# Patient Record
Sex: Male | Born: 1945 | Race: White | Hispanic: No | Marital: Married | Smoking: Never smoker
Health system: Southern US, Community
[De-identification: ages and names within clinical notes are randomized; demographics above are authoritative.]

## PROBLEM LIST (undated history)

## (undated) ENCOUNTER — Ambulatory Visit (HOSPITAL_COMMUNITY): Discharge status: Absent without leave | Payer: Commercial Managed Care - HMO

## (undated) DIAGNOSIS — E039 Hypothyroidism, unspecified: Principal | ICD-10-CM

## (undated) HISTORY — PX: CATARACT EXTRACTION: SUR2

## (undated) HISTORY — PX: APPENDECTOMY: SHX54

## (undated) HISTORY — PX: HERNIA REPAIR: SHX51

## (undated) HISTORY — DX: Hypothyroidism, unspecified: E03.9

## (undated) HISTORY — PX: BACK SURGERY: SHX140

## (undated) HISTORY — PX: VASECTOMY: SHX75

---

## 2007-05-21 ENCOUNTER — Encounter: Admission: RE | Admit: 2007-05-21 | Discharge: 2007-05-21 | Payer: Self-pay | Admitting: Family Medicine

## 2012-09-21 ENCOUNTER — Encounter: Payer: Self-pay | Admitting: Family Medicine

## 2012-09-21 ENCOUNTER — Ambulatory Visit (INDEPENDENT_AMBULATORY_CARE_PROVIDER_SITE_OTHER): Payer: Medicare Other | Admitting: Family Medicine

## 2012-09-21 VITALS — BP 117/71 | HR 85 | Ht 72.0 in | Wt 198.0 lb

## 2012-09-21 DIAGNOSIS — Z131 Encounter for screening for diabetes mellitus: Secondary | ICD-10-CM

## 2012-09-21 DIAGNOSIS — H9190 Unspecified hearing loss, unspecified ear: Secondary | ICD-10-CM

## 2012-09-21 DIAGNOSIS — Z13220 Encounter for screening for lipoid disorders: Secondary | ICD-10-CM

## 2012-09-21 DIAGNOSIS — E039 Hypothyroidism, unspecified: Secondary | ICD-10-CM | POA: Insufficient documentation

## 2012-09-21 HISTORY — DX: Hypothyroidism, unspecified: E03.9

## 2012-09-21 NOTE — Progress Notes (Signed)
CC: Joel Hopkins is a 66 y.o. male is here for Establish Care   Subjective: HPI:  Patient presents to establish care with no acute complaints. Has not seen a physician in over 4 years other than getting DOT physicals.  Carries a diagnosis of hypothyroidism, has been taking nature thyroid once a day. Denies missed doses recently. Denies rapid heartbeat, sluggishness, tremor, anxiety, fatigue, constipation, diarrhea, unintentional weight loss nor gain, skin changes nor hair changes.  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 Seems to have difficulty with high frequencies more than low. Seems to be worse in situations with background noise. Has no trouble understanding voices if he is able to read lips. He does not in the way of his quality of life. He has a mild ringing in both ears without a sense of fullness, ear pain, nor roaring in the ears.  He states he would not like to wear hearing even if indicated.  He believes it's been well over 4 years since he was screen for type 2 diabetes or lipid abnormalities.   Review of Systems - General ROS: negative for - chills, fever, night sweats, weight gain or weight loss Ophthalmic ROS: negative for - decreased vision Psychological ROS: negative for - anxiety or depression ENT ROS: negative for - hearing change, nasal congestion, tinnitus or allergies Hematological and Lymphatic ROS: negative for - bleeding problems, bruising or swollen lymph nodes Breast ROS: negative Respiratory ROS: no cough, shortness of breath, or wheezing Cardiovascular ROS: no chest pain or dyspnea on exertion Gastrointestinal ROS: no abdominal pain, change in bowel habits, or black or bloody stools Genito-Urinary ROS: negative for - genital discharge, genital ulcers, incontinence or abnormal bleeding from genitals Musculoskeletal ROS: negative for - joint pain or muscle pain Neurological ROS:  negative for - headaches or memory loss Dermatological ROS: negative for lumps, mole changes, rash and skin lesion changes  Past Medical History  Diagnosis Date  . Hypothyroid 09/21/2012     Family History  Problem Relation Age of Onset  . Colon cancer Sister   . Diabetes Brother      History  Substance Use Topics  . Smoking status: Never Smoker   . Smokeless tobacco: Not on file  . Alcohol Use: Not on file     Objective: Filed Vitals:   09/21/12 1629  BP: 117/71  Pulse: 85    General: Alert and Oriented, No Acute Distress HEENT: Pupils equal, round, reactive to light. Conjunctivae clear.  External ears unremarkable, canals clear with intact TMs with appropriate landmarks.  Middle ear appears open without effusion. Pink inferior turbinates.  Moist mucous membranes, pharynx without inflammation nor lesions.  Neck supple without palpable lymphadenopathy nor abnormal masses. Lungs: Clear to auscultation bilaterally, no wheezing/ronchi/rales.  Comfortable work of breathing. Good air movement. Cardiac: Regular rate and rhythm. Normal S1/S2.  No murmurs, rubs, nor gallops.   Abdomen: Soft nontender to palpation Extremities: No peripheral edema.  Strong peripheral pulses.  Mental Status: No depression, anxiety, nor agitation. Well-dressed, normal thought process, animated and quite talkative Skin: Warm and dry.  Assessment & Plan: Joel Hopkins was seen today for establish care.  Diagnoses and associated orders for this visit:  Hypothyroid - TSH  Hearing loss  Screening, lipid - Lipid panel  Screening for diabetes mellitus - BASIC METABOLIC PANEL WITH GFR  Other Orders - thyroid (ARMOUR) 32.5 MG tablet; Take 32.5 mg by mouth  daily.    Hypothyroidism: Clinically appears stable however will check TSH to determine need for adjustment of Armour Thyroid were switched to levothyroxine . Hearing loss: Stable, patient declines desire for further workup.  Screen for lipid  abnormalities, screen for type 2 diabetes and renal insufficiency with metabolic panel. I've asked him to return in 4 weeks for CPE    Return in about 4 weeks (around 10/19/2012).

## 2012-10-11 LAB — LIPID PANEL
Cholesterol: 190 mg/dL (ref 0–200)
LDL Cholesterol: 128 mg/dL — ABNORMAL HIGH (ref 0–99)
Triglycerides: 87 mg/dL (ref <150)

## 2012-10-11 LAB — TSH: TSH: 5.633 u[IU]/mL — ABNORMAL HIGH (ref 0.350–4.500)

## 2012-10-11 LAB — BASIC METABOLIC PANEL WITH GFR
CO2: 28 mEq/L (ref 19–32)
Chloride: 104 mEq/L (ref 96–112)
Creat: 1.33 mg/dL (ref 0.50–1.35)
Potassium: 4.1 mEq/L (ref 3.5–5.3)

## 2012-10-12 ENCOUNTER — Telehealth: Payer: Self-pay | Admitting: Family Medicine

## 2012-10-12 DIAGNOSIS — E039 Hypothyroidism, unspecified: Secondary | ICD-10-CM

## 2012-10-12 MED ORDER — THYROID 48.75 MG PO TABS: 48.7500 mg | ORAL_TABLET | Freq: Every day | ORAL | Status: DC

## 2012-10-12 NOTE — Telephone Encounter (Signed)
Joel Hopkins, Will you please let Joel Hopkins know that his thyroid function appears to be slightly underactive.  I've sent in a slightly higher dose of his thyroid medication to [facility] in [place].  New dose is 48.75mg  daily.  We'll need to recheck his thyroid function in three months so I'd like him to have a follow up visit then.  His cholesterol panel looks great, he's at goal so there's no need to make any other medication reccomendations. Thank you

## 2012-10-12 NOTE — Telephone Encounter (Signed)
Pt.notified

## 2012-12-09 ENCOUNTER — Encounter: Payer: Self-pay | Admitting: Family Medicine

## 2012-12-09 ENCOUNTER — Ambulatory Visit (INDEPENDENT_AMBULATORY_CARE_PROVIDER_SITE_OTHER): Payer: Medicare Other | Admitting: Family Medicine

## 2012-12-09 VITALS — BP 121/74 | HR 81 | Temp 97.9°F | Wt 201.0 lb

## 2012-12-09 DIAGNOSIS — H6990 Unspecified Eustachian tube disorder, unspecified ear: Secondary | ICD-10-CM

## 2012-12-09 DIAGNOSIS — H6981 Other specified disorders of Eustachian tube, right ear: Secondary | ICD-10-CM

## 2012-12-09 MED ORDER — MOMETASONE FUROATE 50 MCG/ACT NA SUSP: NASAL | Status: DC

## 2012-12-09 NOTE — Patient Instructions (Addendum)
One aleve twice a day for one week.  Use nasal spray daily for two weeks.

## 2012-12-09 NOTE — Progress Notes (Signed)
CC: Joel Hopkins is a 67 y.o. male is here for right ear pain and Sinusitis   Subjective: HPI:  Patient presents of right ear pain that has been present for 3 weeks. Since Tuesday of this week it has gone from a mild to moderate severity. His behavior has always been spasm-like. He feels as if he has a tightening within his ear without predictability that lasts less than a second. It can occur multiple times in our or no times on an hour. It was interfering with sleep last night, however he's noticed ibuprofen helps prevent the pain. He's noticed that if he is a hot shower with water running over his right head and ear pain does not occur. Nothing else makes better or worse. She's never had this before. He can be present any time of the day. No interventions other than above. He denies fevers, chills, hearing loss, ringing in ears, ear discharge, nasal congestion, facial pain, cough, shortness of breath  Review Of Systems Outlined In HPI  Past Medical History  Diagnosis Date  . Hypothyroid 09/21/2012     Family History  Problem Relation Age of Onset  . Colon cancer Sister   . Diabetes Brother      History  Substance Use Topics  . Smoking status: Never Smoker   . Smokeless tobacco: Not on file  . Alcohol Use: Not on file     Objective: Filed Vitals:   12/09/12 0904  BP: 121/74  Pulse: 81  Temp: 97.9 F (36.6 C)    General: Alert and Oriented, No Acute Distress HEENT: Pupils equal, round, reactive to light. Conjunctivae clear.  External ears unremarkable, canals clear with intact TMs with appropriate landmarks.  Right middle ear has a clear effusion without bulging of the TM. Left middle ear is open without abnormalities.Pink inferior turbinates.  Moist mucous membranes, pharynx without inflammation nor lesions.  Neck supple without palpable lymphadenopathy nor abnormal masses. Lungs: Clear to auscultation bilaterally, no wheezing/ronchi/rales.  Comfortable work of breathing.  Good air movement. Cardiac: Regular rate and rhythm. Normal S1/S2.  No murmurs, rubs, nor gallops.  No carotid bruits Neuro: Cranial nerves II through XII grossly intact Mental Status: No depression, anxiety, nor agitation. Skin: Warm and dry.  Assessment & Plan: Joel Hopkins was seen today for right ear pain and sinusitis.  Diagnoses and associated orders for this visit:  Eustachian tube dysfunction, right - mometasone (NASONEX) 50 MCG/ACT nasal spray; Two sprays each nostril daily for two weeks.     discussed with patient that exam would suggest eustachian tube dysfunction on the right and pain is likely do to stapes muscle spasm. Treat with Nasonex daily for 2 weeks and Aleve twice a day as needed to help prevent the pain for the next week.  Return if symptoms worsen or fail to improve.

## 2013-03-18 ENCOUNTER — Other Ambulatory Visit: Payer: Self-pay | Admitting: Family Medicine

## 2013-04-11 ENCOUNTER — Other Ambulatory Visit: Payer: Self-pay | Admitting: Family Medicine

## 2013-05-27 ENCOUNTER — Encounter: Payer: Self-pay | Admitting: Family Medicine

## 2013-05-27 ENCOUNTER — Ambulatory Visit (INDEPENDENT_AMBULATORY_CARE_PROVIDER_SITE_OTHER): Payer: Self-pay | Admitting: Family Medicine

## 2013-05-27 VITALS — BP 118/76 | HR 95 | Temp 97.9°F | Wt 198.0 lb

## 2013-05-27 DIAGNOSIS — B9689 Other specified bacterial agents as the cause of diseases classified elsewhere: Secondary | ICD-10-CM

## 2013-05-27 DIAGNOSIS — J329 Chronic sinusitis, unspecified: Secondary | ICD-10-CM

## 2013-05-27 DIAGNOSIS — A499 Bacterial infection, unspecified: Secondary | ICD-10-CM

## 2013-05-27 MED ORDER — AMOXICILLIN-POT CLAVULANATE 500-125 MG PO TABS: ORAL_TABLET | ORAL | Status: AC

## 2013-05-27 NOTE — Progress Notes (Signed)
CC: Joel Hopkins is a 67 y.o. male is here for Nasal Congestion   Subjective: HPI:  Complains of nasal congestion right ear stuffiness of moderate severity that is worsening on a daily basis for the last 2-3 weeks. Seems to be present all hours of the day nothing seems to make better or worse. He is not taking anything to address this yet. No interventions as of yet. Slight hearing loss in right ear since this started denies headaches motor or sensory disturbances. Complains of hoarseness and also subjective fevers for the last 2 days. Denies productive cough, chest pain, shortness of breath, wheezing, nor rashes.   Review Of Systems Outlined In HPI  Past Medical History  Diagnosis Date  . Hypothyroid 09/21/2012     Family History  Problem Relation Age of Onset  . Colon cancer Sister   . Diabetes Brother      History  Substance Use Topics  . Smoking status: Never Smoker   . Smokeless tobacco: Not on file  . Alcohol Use: Not on file     Objective: Filed Vitals:   05/27/13 1032  BP: 118/76  Pulse: 95  Temp: 97.9 F (36.6 C)    General: Alert and Oriented, No Acute Distress HEENT: Pupils equal, round, reactive to light. Conjunctivae clear.  External ears unremarkable, canals clear with intact TMs with appropriate landmarks.  Middle ear appears open without effusion. Boggy and erythematous inferior turbinates with mild mucoid discharge.  Moist mucous membranes, pharynx without inflammation nor lesions.  Neck supple without palpable lymphadenopathy nor abnormal masses. Lungs: Clear to auscultation bilaterally, no wheezing/ronchi/rales.  Comfortable work of breathing. Good air movement. Cardiac: Regular rate and rhythm. Normal S1/S2.  No murmurs, rubs, nor gallops.   Extremities: No peripheral edema.  Strong peripheral pulses.  Mental Status: No depression, anxiety, nor agitation. Skin: Warm and dry.  Assessment & Plan: Teller was seen today for nasal  congestion.  Diagnoses and associated orders for this visit:  Bacterial sinusitis - amoxicillin-clavulanate (AUGMENTIN) 500-125 MG per tablet; Take one by mouth every 8 hours for ten total days.    Bacterial sinusitis: Start over-the-counter Alka-Seltzer cold and Sinus as well as Augmentin, cough no improvement by next week  Return if symptoms worsen or fail to improve.

## 2013-06-09 ENCOUNTER — Encounter: Payer: Self-pay | Admitting: Family Medicine

## 2013-06-13 ENCOUNTER — Encounter: Payer: Self-pay | Admitting: Family Medicine

## 2013-06-13 ENCOUNTER — Ambulatory Visit (INDEPENDENT_AMBULATORY_CARE_PROVIDER_SITE_OTHER): Payer: Medicare Other | Admitting: Family Medicine

## 2013-06-13 VITALS — BP 115/77 | HR 67 | Wt 198.0 lb

## 2013-06-13 DIAGNOSIS — S060X0A Concussion without loss of consciousness, initial encounter: Secondary | ICD-10-CM

## 2013-06-13 DIAGNOSIS — M47812 Spondylosis without myelopathy or radiculopathy, cervical region: Secondary | ICD-10-CM | POA: Insufficient documentation
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
## 2013-06-20 ENCOUNTER — Encounter: Payer: Self-pay | Admitting: Family Medicine

## 2013-06-20 ENCOUNTER — Ambulatory Visit (INDEPENDENT_AMBULATORY_CARE_PROVIDER_SITE_OTHER): Payer: Medicare Other | Admitting: Family Medicine

## 2013-06-20 VITALS — BP 122/70 | HR 70 | Wt 200.0 lb

## 2013-06-20 DIAGNOSIS — S060X0D Concussion without loss of consciousness, subsequent encounter: Secondary | ICD-10-CM

## 2013-06-20 DIAGNOSIS — H9311 Tinnitus, right ear: Secondary | ICD-10-CM

## 2013-06-20 DIAGNOSIS — Z5189 Encounter for other specified aftercare: Secondary | ICD-10-CM

## 2013-06-20 DIAGNOSIS — H9319 Tinnitus, unspecified ear: Secondary | ICD-10-CM

## 2013-06-20 DIAGNOSIS — H9193 Unspecified hearing loss, bilateral: Secondary | ICD-10-CM

## 2013-06-20 DIAGNOSIS — H9190 Unspecified hearing loss, unspecified ear: Secondary | ICD-10-CM
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
## 2013-06-24 ENCOUNTER — Telehealth: Payer: Self-pay | Admitting: Family Medicine

## 2013-06-24 ENCOUNTER — Encounter: Payer: Self-pay | Admitting: Family Medicine

## 2013-06-24 ENCOUNTER — Ambulatory Visit (INDEPENDENT_AMBULATORY_CARE_PROVIDER_SITE_OTHER): Payer: Medicare Other | Admitting: Family Medicine

## 2013-06-24 VITALS — BP 112/73 | HR 73 | Temp 97.6°F | Wt 197.0 lb

## 2013-06-24 DIAGNOSIS — R197 Diarrhea, unspecified: Secondary | ICD-10-CM

## 2013-06-24 DIAGNOSIS — R1032 Left lower quadrant pain: Secondary | ICD-10-CM

## 2013-06-24 DIAGNOSIS — E039 Hypothyroidism, unspecified: Secondary | ICD-10-CM

## 2013-06-24 LAB — TSH: TSH: 6.431 u[IU]/mL — ABNORMAL HIGH (ref 0.350–4.500)

## 2013-06-24 LAB — COMPLETE METABOLIC PANEL WITH GFR
ALT: 26 U/L (ref 0–53)
AST: 22 U/L (ref 0–37)
Albumin: 4.1 g/dL (ref 3.5–5.2)
Alkaline Phosphatase: 81 U/L (ref 39–117)
Calcium: 9.3 mg/dL (ref 8.4–10.5)
Chloride: 105 mEq/L (ref 96–112)
Potassium: 4.5 mEq/L (ref 3.5–5.3)
Sodium: 140 mEq/L (ref 135–145)

## 2013-06-24 LAB — CBC WITH DIFFERENTIAL/PLATELET
Hemoglobin: 14.7 g/dL (ref 13.0–17.0)
Lymphocytes Relative: 22 % (ref 12–46)
Lymphs Abs: 1.6 10*3/uL (ref 0.7–4.0)
MCH: 30.5 pg (ref 26.0–34.0)
Monocytes Relative: 8 % (ref 3–12)
Neutro Abs: 4.6 10*3/uL (ref 1.7–7.7)
Neutrophils Relative %: 64 % (ref 43–77)
Platelets: 78 10*3/uL — ABNORMAL LOW (ref 150–400)
RBC: 4.82 MIL/uL (ref 4.22–5.81)
WBC: 7.2 10*3/uL (ref 4.0–10.5)

## 2013-06-24 LAB — SEDIMENTATION RATE: Sed Rate: 1 mm/hr (ref 0–16)

## 2013-06-24 LAB — C-REACTIVE PROTEIN: CRP: 1 mg/dL — ABNORMAL HIGH (ref <0.60)

## 2013-06-24 MED ORDER — DIPHENOXYLATE-ATROPINE 2.5-0.025 MG PO TABS: 1.0000 | ORAL_TABLET | Freq: Four times a day (QID) | ORAL | Status: DC | PRN

## 2013-06-24 NOTE — Telephone Encounter (Signed)
Sue Lush, Will you please let Mr. Hreha know that his labs today do not suggest his diarrhea is from an infection which is good news.  I'd encourage him to start a generic form of Lomotil (in your inbox) over the weekend.  I'm going to place a GI referral in case this does not improve over the weekend.
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
## 2013-06-24 NOTE — Telephone Encounter (Signed)
I've never heard of such a situation and think that that would be pretty odd, however, since I don't have a good explanation I've placed a GI referral for further evaluation since it's been going on for so long.
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
## 2013-06-27 ENCOUNTER — Telehealth: Payer: Self-pay | Admitting: Family Medicine

## 2013-06-27 DIAGNOSIS — E039 Hypothyroidism, unspecified: Secondary | ICD-10-CM

## 2013-06-27 NOTE — Telephone Encounter (Signed)
Pt notified this am.

## 2013-06-27 NOTE — Telephone Encounter (Signed)
Sue Lush, Will you please let [name] (or Claris Gower his wife) know that his thyroid test from last Friday shows that his thyroid supplement is at too low of a dose.  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

## 2013-06-27 NOTE — Telephone Encounter (Signed)
Left message to call back  

## 2013-06-29 ENCOUNTER — Encounter: Payer: Self-pay | Admitting: Family Medicine

## 2013-06-29 DIAGNOSIS — R197 Diarrhea, unspecified: Secondary | ICD-10-CM | POA: Insufficient documentation

## 2013-06-29 NOTE — Telephone Encounter (Signed)
Left messge on vm yesterday with results and to call back to confirm that he has been taking the med daily

## 2013-06-30 ENCOUNTER — Telehealth: Payer: Self-pay | Admitting: Family Medicine

## 2013-06-30 DIAGNOSIS — S060X0S Concussion without loss of consciousness, sequela: Secondary | ICD-10-CM

## 2013-06-30 NOTE — Telephone Encounter (Signed)
Patient still having issues and is requesting you refer him to a neurologist

## 2013-07-01 NOTE — Telephone Encounter (Signed)
Sue Lush, Will you please let Mr. Haws know this has been placed.

## 2013-07-01 NOTE — Telephone Encounter (Signed)
Pt is taking the thyroid med daily

## 2013-07-01 NOTE — Telephone Encounter (Signed)
Pt.notified

## 2013-07-04 MED ORDER — THYROID 65 MG PO TABS: 65.0000 mg | ORAL_TABLET | Freq: Every day | ORAL | Status: DC

## 2013-07-22 ENCOUNTER — Encounter: Payer: Self-pay | Admitting: Family Medicine

## 2013-08-11 ENCOUNTER — Encounter: Payer: Self-pay | Admitting: Family Medicine

## 2013-08-11 DIAGNOSIS — Z9889 Other specified postprocedural states: Secondary | ICD-10-CM | POA: Insufficient documentation

## 2013-08-11 HISTORY — DX: Other specified postprocedural states: Z98.890

## 2013-08-25 ENCOUNTER — Encounter: Payer: Self-pay | Admitting: Family Medicine

## 2013-09-20 ENCOUNTER — Encounter: Payer: Self-pay | Admitting: Family Medicine

## 2013-09-20 ENCOUNTER — Ambulatory Visit (INDEPENDENT_AMBULATORY_CARE_PROVIDER_SITE_OTHER): Payer: Medicare Other | Admitting: Family Medicine

## 2013-09-20 VITALS — BP 115/69 | HR 73 | Temp 97.8°F | Wt 201.0 lb

## 2013-09-20 DIAGNOSIS — J329 Chronic sinusitis, unspecified: Secondary | ICD-10-CM

## 2013-09-20 DIAGNOSIS — A499 Bacterial infection, unspecified: Secondary | ICD-10-CM

## 2013-09-20 DIAGNOSIS — B9689 Other specified bacterial agents as the cause of diseases classified elsewhere: Secondary | ICD-10-CM

## 2013-09-20 MED ORDER — AMOXICILLIN-POT CLAVULANATE 500-125 MG PO TABS: ORAL_TABLET | ORAL | Status: AC

## 2013-09-20 NOTE — Progress Notes (Signed)
CC: Joel Hopkins is a 67 y.o. male is here for Sinusitis   Subjective: HPI:  Complains of nasal congestion and pressure in the forehead that has been present for a little over one week worsening on a daily basis moderate in severity. Worse in the evenings when trying to sleep. Conjunctivae subjective postnasal drip and a dry nonproductive cough. Denies fevers, chills, motor sensory disturbances, dysphagia. No interventions as of yet   Review Of Systems Outlined In HPI  Past Medical History  Diagnosis Date  . Hypothyroid 09/21/2012     Family History  Problem Relation Age of Onset  . Colon cancer Sister   . Diabetes Brother      History  Substance Use Topics  . Smoking status: Never Smoker   . Smokeless tobacco: Not on file  . Alcohol Use: Not on file     Objective: Filed Vitals:   09/20/13 1136  BP: 115/69  Pulse: 73  Temp: 97.8 F (36.6 C)    General: Alert and Oriented, No Acute Distress HEENT: Pupils equal, round, reactive to light. Conjunctivae clear.  External ears unremarkable, canals clear with intact TMs with appropriate landmarks.  Middle ear appears open without effusion. Boggy erythematous inferior turbinates.  Moist mucous membranes, pharynx without inflammation nor lesions however mild cobblestoning.  Neck supple without palpable lymphadenopathy nor abnormal masses. Lungs: Clear to auscultation bilaterally, no wheezing/ronchi/rales.  Comfortable work of breathing. Good air movement. Cardiac: Regular rate and rhythm. Normal S1/S2.  No murmurs, rubs, nor gallops.   Mental Status: No depression, anxiety, nor agitation. Skin: Warm and dry.  Assessment & Plan: Joel Hopkins was seen today for sinusitis.  Diagnoses and associated orders for this visit:  Bacterial sinusitis - amoxicillin-clavulanate (AUGMENTIN) 500-125 MG per tablet; Take one by mouth every 8 hours for ten total days.    Bacterial sinusitis: Start Augmentin consider Alka-Seltzer cold and sinus  and nasal saline washes  Return if symptoms worsen or fail to improve.

## 2013-10-05 ENCOUNTER — Encounter: Payer: Self-pay | Admitting: Family Medicine

## 2013-10-05 ENCOUNTER — Ambulatory Visit (INDEPENDENT_AMBULATORY_CARE_PROVIDER_SITE_OTHER): Payer: Medicare Other | Admitting: Family Medicine

## 2013-10-05 VITALS — BP 124/73 | HR 88 | Temp 97.7°F | Wt 201.0 lb

## 2013-10-05 DIAGNOSIS — E039 Hypothyroidism, unspecified: Secondary | ICD-10-CM

## 2013-10-05 DIAGNOSIS — J040 Acute laryngitis: Secondary | ICD-10-CM

## 2013-10-05 NOTE — Progress Notes (Signed)
CC: Joel Hopkins is a 67 y.o. male is here for Nasal Congestion   Subjective: HPI:  Complains of 2 days of hoarse voice scratchiness sensation in the back of his throat mild in severity has not been getting better or worsens acute onset. Denies any other complaints today states he probably wouldn't be or if his wife didn't make him come in. Interventions have included Aleve without any benefit. Denies fevers, chills, cough, shortness of breath, wheezing, nor sore throat.   Followup hyperthyroidism: Approximately 3 months ago we increased his Armour Thyroid. He denies any weight loss, weight gain, anxiety, tremor, depression, fatigue.   Review Of Systems Outlined In HPI  Past Medical History  Diagnosis Date  . Hypothyroid 09/21/2012     Family History  Problem Relation Age of Onset  . Colon cancer Sister   . Diabetes Brother      History  Substance Use Topics  . Smoking status: Never Smoker   . Smokeless tobacco: Not on file  . Alcohol Use: Not on file     Objective: Filed Vitals:   10/05/13 1124  BP: 124/73  Pulse: 88  Temp: 97.7 F (36.5 C)    General: Alert and Oriented, No Acute Distress HEENT: Pupils equal, round, reactive to light. Conjunctivae clear.  External ears unremarkable, canals clear with intact TMs with appropriate landmarks.  Middle ear appears open without effusion. Pink inferior turbinates.  Moist mucous membranes, pharynx without inflammation nor lesions.  Neck supple without palpable lymphadenopathy nor abnormal masses. Lungs: Clear to auscultation bilaterally, no wheezing/ronchi/rales.  Comfortable work of breathing. Good air movement. Cardiac: Regular rate and rhythm. Normal S1/S2.  No murmurs, rubs, nor gallops.    Assessment & Plan: Urban was seen today for nasal congestion.  Diagnoses and associated orders for this visit:  Hypothyroid - TSH  Laryngitis    Laryngitis: Discussed this should self resolve within the next week  symptomatically care can include humidifier, cough drops, salt water gargles, warm beverages. Hypothyroidism: Due for repeat TSH following thyroid supplementation adjustment   Return if symptoms worsen or fail to improve.

## 2013-10-06 LAB — TSH: TSH: 2.89 u[IU]/mL (ref 0.350–4.500)

## 2013-10-19 ENCOUNTER — Telehealth: Payer: Self-pay | Admitting: Family Medicine

## 2013-10-19 DIAGNOSIS — S060X0A Concussion without loss of consciousness, initial encounter: Secondary | ICD-10-CM

## 2013-10-19 NOTE — Telephone Encounter (Signed)
Jen, Placed in the folder up front.

## 2013-10-25 ENCOUNTER — Telehealth: Payer: Self-pay | Admitting: *Deleted

## 2013-10-25 NOTE — Telephone Encounter (Signed)
Pt's wife left a message that pt will need a referral to [facility] for dr. Debbe Bales (sp?) he has been seeing this doctor for years but his new insurance requires a referral. Pt has an appt next week

## 2013-11-02 ENCOUNTER — Telehealth: Payer: Self-pay | Admitting: Family Medicine

## 2013-11-02 DIAGNOSIS — L989 Disorder of the skin and subcutaneous tissue, unspecified: Secondary | ICD-10-CM

## 2013-11-02 NOTE — Telephone Encounter (Signed)
Patient needs ref to [facility] - Dr Justice Britain  Please send it over to their office.  He hasn't been seen yet so please put in asap and notify wife.  [name] is his wife and please notify her when it has been sent over  [phone].

## 2013-11-29 ENCOUNTER — Ambulatory Visit (INDEPENDENT_AMBULATORY_CARE_PROVIDER_SITE_OTHER): Payer: Medicare HMO | Admitting: Family Medicine

## 2013-11-29 ENCOUNTER — Ambulatory Visit: Payer: Medicare HMO | Admitting: Family Medicine

## 2013-11-29 ENCOUNTER — Encounter: Payer: Self-pay | Admitting: Family Medicine

## 2013-11-29 VITALS — BP 116/77 | HR 86

## 2013-11-29 DIAGNOSIS — M5412 Radiculopathy, cervical region: Secondary | ICD-10-CM

## 2013-11-29 MED ORDER — GABAPENTIN 300 MG PO CAPS: ORAL_CAPSULE | ORAL | Status: DC
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
## 2013-12-23 ENCOUNTER — Other Ambulatory Visit: Payer: Self-pay | Admitting: Family Medicine

## 2014-01-16 ENCOUNTER — Ambulatory Visit (INDEPENDENT_AMBULATORY_CARE_PROVIDER_SITE_OTHER): Payer: Medicare HMO | Admitting: Family Medicine

## 2014-01-16 ENCOUNTER — Encounter: Payer: Self-pay | Admitting: Family Medicine

## 2014-01-16 VITALS — BP 124/76 | HR 53 | Ht 72.0 in | Wt 198.0 lb

## 2014-01-16 DIAGNOSIS — Z125 Encounter for screening for malignant neoplasm of prostate: Secondary | ICD-10-CM

## 2014-01-16 DIAGNOSIS — E039 Hypothyroidism, unspecified: Secondary | ICD-10-CM

## 2014-01-16 DIAGNOSIS — Z13220 Encounter for screening for lipoid disorders: Secondary | ICD-10-CM

## 2014-01-16 DIAGNOSIS — H8110 Benign paroxysmal vertigo, unspecified ear: Secondary | ICD-10-CM

## 2014-01-16 DIAGNOSIS — R972 Elevated prostate specific antigen [PSA]: Secondary | ICD-10-CM
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
## 2014-01-17 LAB — LIPID PANEL
Cholesterol: 188 mg/dL (ref 0–200)
HDL: 47 mg/dL (ref >39)
LDL CALC: 125 mg/dL — AB (ref 0–99)
Total CHOL/HDL Ratio: 4 Ratio
Triglycerides: 80 mg/dL (ref <150)
VLDL: 16 mg/dL (ref 0–40)

## 2014-01-18 ENCOUNTER — Telehealth: Payer: Self-pay | Admitting: Family Medicine

## 2014-01-18 DIAGNOSIS — R972 Elevated prostate specific antigen [PSA]: Secondary | ICD-10-CM

## 2014-01-18 DIAGNOSIS — C61 Malignant neoplasm of prostate: Secondary | ICD-10-CM | POA: Insufficient documentation

## 2014-01-18 LAB — PSA, TOTAL AND FREE
PSA FREE: 0.97 ng/mL
PSA, Free Pct: 18 % — ABNORMAL LOW (ref >25)
PSA: 5.49 ng/mL — AB (ref <=4.00)

## 2014-01-18 LAB — TSH: TSH: 2.549 u[IU]/mL (ref 0.350–4.500)

## 2014-01-18 NOTE — Telephone Encounter (Signed)
Pt's wife notified and pt notified. Wife may have a specific urologist she wants pt to go to. She will call back to let me know

## 2014-01-18 NOTE — Telephone Encounter (Signed)
[name], Will you please let patient know that his total PSA remains slightly elevated at 5.5, anything below 4 is considered normal.  One of the more specific confirmatory tests shows that he is indeed at higher risk than the average male his age for developing prostate cancer.  I'd strongly recommend a urology referral which I've ordered so he can discuss further surveillance.  Cholesterol and thyroid function were within acceptable limits, no change to medication regimen.

## 2014-01-19 NOTE — Telephone Encounter (Signed)
Spouse called back and wants husband to go to [facility] and they have a [place] office.[phone]. ( I know its whatever is available but in the message the wife charlotte states "after school" hours are better so she can go to the appt also

## 2014-01-27 ENCOUNTER — Other Ambulatory Visit: Payer: Self-pay | Admitting: Family Medicine

## 2014-02-15 ENCOUNTER — Encounter: Payer: Self-pay | Admitting: Family Medicine

## 2014-04-08 ENCOUNTER — Other Ambulatory Visit: Payer: Self-pay | Admitting: Family Medicine

## 2014-06-08 ENCOUNTER — Other Ambulatory Visit: Payer: Self-pay | Admitting: Family Medicine

## 2014-07-25 ENCOUNTER — Encounter: Payer: Self-pay | Admitting: Family Medicine

## 2014-07-25 ENCOUNTER — Ambulatory Visit (INDEPENDENT_AMBULATORY_CARE_PROVIDER_SITE_OTHER): Payer: Medicare HMO | Admitting: Family Medicine

## 2014-07-25 ENCOUNTER — Ambulatory Visit (INDEPENDENT_AMBULATORY_CARE_PROVIDER_SITE_OTHER): Payer: Medicare HMO

## 2014-07-25 VITALS — BP 119/73 | HR 65 | Wt 199.0 lb

## 2014-07-25 DIAGNOSIS — R5382 Chronic fatigue, unspecified: Secondary | ICD-10-CM

## 2014-07-25 DIAGNOSIS — E039 Hypothyroidism, unspecified: Secondary | ICD-10-CM

## 2014-07-25 DIAGNOSIS — M79644 Pain in right finger(s): Secondary | ICD-10-CM

## 2014-07-25 DIAGNOSIS — L989 Disorder of the skin and subcutaneous tissue, unspecified: Secondary | ICD-10-CM
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
## 2014-07-26 ENCOUNTER — Telehealth: Payer: Self-pay | Admitting: Family Medicine

## 2014-07-26 DIAGNOSIS — IMO0001 Reserved for inherently not codable concepts without codable children: Secondary | ICD-10-CM

## 2014-07-26 DIAGNOSIS — M79644 Pain in right finger(s): Secondary | ICD-10-CM

## 2014-07-26 LAB — COMPLETE METABOLIC PANEL WITH GFR
ALBUMIN: 4.2 g/dL (ref 3.5–5.2)
ALK PHOS: 61 U/L (ref 39–117)
ALT: 16 U/L (ref 0–53)
AST: 15 U/L (ref 0–37)
BUN: 20 mg/dL (ref 6–23)
CO2: 30 mEq/L (ref 19–32)
Calcium: 9.6 mg/dL (ref 8.4–10.5)
Chloride: 105 mEq/L (ref 96–112)
Creat: 1.23 mg/dL (ref 0.50–1.35)
GFR, Est African American: 70 mL/min
GFR, Est Non African American: 61 mL/min
Glucose, Bld: 89 mg/dL (ref 70–99)
POTASSIUM: 5.3 meq/L (ref 3.5–5.3)
Sodium: 141 mEq/L (ref 135–145)
Total Bilirubin: 0.3 mg/dL (ref 0.2–1.2)
Total Protein: 6.5 g/dL (ref 6.0–8.3)

## 2014-07-26 LAB — CBC
HEMATOCRIT: 42 % (ref 39.0–52.0)
HEMOGLOBIN: 14.1 g/dL (ref 13.0–17.0)
MCH: 30 pg (ref 26.0–34.0)
MCHC: 33.6 g/dL (ref 30.0–36.0)
MCV: 89.4 fL (ref 78.0–100.0)
RBC: 4.7 MIL/uL (ref 4.22–5.81)
RDW: 13.6 % (ref 11.5–15.5)
WBC: 6.3 10*3/uL (ref 4.0–10.5)

## 2014-07-26 LAB — TSH: TSH: 2.622 u[IU]/mL (ref 0.350–4.500)

## 2014-07-26 MED ORDER — THYROID 65 MG PO TABS: ORAL_TABLET | ORAL | Status: DC

## 2014-07-26 NOTE — Telephone Encounter (Signed)
[name], Will you please let patient know that his blood work was normal and did not find any source of his fatigue.  I suspect it's coming from his body reacting to the foreign object in his finger.  His xray did not show any object where his pain is despite what we saw on the ultrasound.  I'd recommend he visit with a hand surgeon for further management, I've placed a referral for this.

## 2014-07-27 NOTE — Telephone Encounter (Signed)
Pt.notified

## 2014-08-14 ENCOUNTER — Encounter: Payer: Self-pay | Admitting: Family Medicine

## 2014-08-14 DIAGNOSIS — S60459A Superficial foreign body of unspecified finger, initial encounter: Secondary | ICD-10-CM | POA: Insufficient documentation

## 2014-09-10 ENCOUNTER — Encounter: Payer: Self-pay | Admitting: Family Medicine

## 2014-09-12 ENCOUNTER — Telehealth: Payer: Self-pay | Admitting: *Deleted

## 2014-09-12 DIAGNOSIS — S0990XS Unspecified injury of head, sequela: Principal | ICD-10-CM

## 2014-09-12 DIAGNOSIS — G44309 Post-traumatic headache, unspecified, not intractable: Secondary | ICD-10-CM

## 2014-09-12 DIAGNOSIS — M5412 Radiculopathy, cervical region: Secondary | ICD-10-CM

## 2014-09-13 NOTE — Telephone Encounter (Signed)
Caryl Pina from [facility] says pt insurance requires a Nutritional therapist

## 2014-09-19 ENCOUNTER — Telehealth: Payer: Self-pay | Admitting: *Deleted

## 2014-09-19 DIAGNOSIS — R972 Elevated prostate specific antigen [PSA]: Secondary | ICD-10-CM

## 2014-09-19 NOTE — Telephone Encounter (Signed)
[facility] Apolonio Schneiders) needs a referral to see Dr. Sherryll Burger. Pt is already a pt there

## 2014-10-04 ENCOUNTER — Ambulatory Visit (INDEPENDENT_AMBULATORY_CARE_PROVIDER_SITE_OTHER): Payer: Medicare HMO | Admitting: Family Medicine

## 2014-10-04 ENCOUNTER — Encounter: Payer: Self-pay | Admitting: Family Medicine

## 2014-10-04 VITALS — BP 109/71 | HR 79 | Temp 97.8°F | Wt 195.0 lb

## 2014-10-04 DIAGNOSIS — J329 Chronic sinusitis, unspecified: Secondary | ICD-10-CM

## 2014-10-04 DIAGNOSIS — B9689 Other specified bacterial agents as the cause of diseases classified elsewhere: Secondary | ICD-10-CM

## 2014-10-04 DIAGNOSIS — A499 Bacterial infection, unspecified: Secondary | ICD-10-CM

## 2014-10-04 MED ORDER — DOXYCYCLINE HYCLATE 100 MG PO TABS: ORAL_TABLET | ORAL | Status: AC

## 2014-10-04 NOTE — Progress Notes (Signed)
CC: Joel Hopkins is a 68 y.o. male is here for Nasal Congestion   Subjective: HPI:  Facial pressure localized to the forehead with nasal congestion and subjective postnasal drip. Symptoms have been present for 8 days. Facial pressure is described as pulsatile if he is leaning forward and constant if sitting up. No interventions as of yet. Accompanied by fatigue and chills denies fever. Denies cough, shortness of breath, wheezing, nor myalgias.   Review Of Systems Outlined In HPI  Past Medical History  Diagnosis Date  . Hypothyroid 09/21/2012    Past Surgical History  Procedure Laterality Date  . Cataract extraction    . Appendectomy    . Hernia repair     Family History  Problem Relation Age of Onset  . Colon cancer Sister   . Diabetes Brother     History   Social History  . Marital Status: Married    Spouse Name: N/A    Number of Children: N/A  . Years of Education: N/A   Occupational History  . Not on file.   Social History Main Topics  . Smoking status: Never Smoker   . Smokeless tobacco: Not on file  . Alcohol Use: Not on file  . Drug Use: No  . Sexual Activity: Yes   Other Topics Concern  . Not on file   Social History Narrative     Objective: BP 109/71 mmHg  Pulse 79  Temp(Src) 97.8 F (36.6 C) (Oral)  Wt 195 lb (88.451 kg)  General: Alert and Oriented, No Acute Distress HEENT: Pupils equal, round, reactive to light. Conjunctivae clear.  External ears unremarkable, canals clear with intact TMs with appropriate landmarks.  Middle ear appears open without effusion. Boggy erythematous inferior turbinates with mild mucoid discharge.  Moist mucous membranes, pharynx without inflammation nor lesions.  Neck supple without palpable lymphadenopathy nor abnormal masses. Lungs: Clear to auscultation bilaterally, no wheezing/ronchi/rales.  Comfortable work of breathing. Good air movement. Extremities: No peripheral edema.  Strong peripheral pulses.  Mental  Status: No depression, anxiety, nor agitation. Skin: Warm and dry.  Assessment & Plan: Antwion was seen today for nasal congestion.  Diagnoses and associated orders for this visit:  Bacterial sinusitis - doxycycline (VIBRA-TABS) 100 MG tablet; One by mouth twice a day for ten days.    Bacterial sinusitis: Consider nasal saline washes and Alka-Seltzer cold and sinus. Begin doxycycline  Return if symptoms worsen or fail to improve.

## 2014-10-11 DIAGNOSIS — R200 Anesthesia of skin: Secondary | ICD-10-CM | POA: Diagnosis not present

## 2014-10-11 DIAGNOSIS — J019 Acute sinusitis, unspecified: Secondary | ICD-10-CM | POA: Diagnosis not present

## 2014-10-11 DIAGNOSIS — G9389 Other specified disorders of brain: Secondary | ICD-10-CM | POA: Diagnosis not present

## 2014-10-16 ENCOUNTER — Telehealth: Payer: Self-pay | Admitting: *Deleted

## 2014-10-16 DIAGNOSIS — J0100 Acute maxillary sinusitis, unspecified: Secondary | ICD-10-CM

## 2014-10-16 NOTE — Telephone Encounter (Signed)
Pt states he had an MRI on his head and it showed a bad sinus infection. He just finished a course of abx for sinus infection. Pt states he is still congested and has headaches but he was having HA's from and injury. He is not sure if the HA's are from sinus issues or injury. Pt wants to know if he needs to come in to get another abx. I have requested a copy of MRI report from [facility] in [place].

## 2014-10-17 MED ORDER — PREDNISONE 20 MG PO TABS: ORAL_TABLET | ORAL | Status: AC

## 2014-10-17 MED ORDER — LEVOFLOXACIN 500 MG PO TABS: 500.0000 mg | ORAL_TABLET | Freq: Every day | ORAL | Status: DC

## 2014-10-17 NOTE — Telephone Encounter (Signed)
Pt.notified

## 2014-10-17 NOTE — Telephone Encounter (Signed)
He does not need to come in for an appt since I can see the MRI results through care everywhere, I've sent a stronger antibiotic, levaquin, and prednisone to his [facility] to help clear up his sinuses.

## 2014-11-09 DIAGNOSIS — R9082 White matter disease, unspecified: Secondary | ICD-10-CM | POA: Diagnosis not present

## 2014-11-09 DIAGNOSIS — G5601 Carpal tunnel syndrome, right upper limb: Secondary | ICD-10-CM | POA: Diagnosis not present

## 2014-11-09 DIAGNOSIS — R201 Hypoesthesia of skin: Secondary | ICD-10-CM | POA: Diagnosis not present

## 2014-11-09 DIAGNOSIS — M542 Cervicalgia: Secondary | ICD-10-CM | POA: Diagnosis not present

## 2014-11-09 DIAGNOSIS — G5602 Carpal tunnel syndrome, left upper limb: Secondary | ICD-10-CM | POA: Diagnosis not present

## 2014-12-08 ENCOUNTER — Encounter: Payer: Self-pay | Admitting: *Deleted

## 2014-12-08 ENCOUNTER — Emergency Department
Admission: EM | Admit: 2014-12-08 | Discharge: 2014-12-08 | Disposition: A | Discharge status: Home / Self Care | Payer: Commercial Managed Care - HMO | Source: Home / Self Care | Attending: Family Medicine | Admitting: Family Medicine

## 2014-12-08 DIAGNOSIS — R197 Diarrhea, unspecified: Secondary | ICD-10-CM

## 2014-12-08 DIAGNOSIS — J111 Influenza due to unidentified influenza virus with other respiratory manifestations: Secondary | ICD-10-CM

## 2014-12-08 DIAGNOSIS — R110 Nausea: Secondary | ICD-10-CM | POA: Diagnosis not present

## 2014-12-08 DIAGNOSIS — R69 Illness, unspecified: Principal | ICD-10-CM

## 2014-12-08 MED ORDER — ONDANSETRON 4 MG PO TBDP: 4.0000 mg | ORAL_TABLET | Freq: Once | ORAL | Status: DC

## 2014-12-08 MED ORDER — ONDANSETRON 4 MG PO TBDP: ORAL_TABLET | ORAL | Status: DC

## 2014-12-08 MED ORDER — BENZONATATE 200 MG PO CAPS: 200.0000 mg | ORAL_CAPSULE | Freq: Every day | ORAL | Status: DC

## 2014-12-08 MED ORDER — OSELTAMIVIR PHOSPHATE 75 MG PO CAPS: 75.0000 mg | ORAL_CAPSULE | Freq: Two times a day (BID) | ORAL | Status: DC

## 2014-12-08 NOTE — ED Notes (Signed)
Diarrhea x 2 days. Yesterday developed bodyaches, chills, dry cough and ear pain. Rec'd flu vac this season.

## 2014-12-08 NOTE — Discharge Instructions (Signed)
Begin clear liquids (Pedialyte while having diarrhea) until improved, then advance to a BRAT diet (Bananas, Rice, Applesauce, Toast).  Then gradually resume a regular diet when tolerated.  Avoid milk products until well.  To decrease diarrhea, mix one teaspoon Citrucel (methylcellulose) in 2 oz water and drink one to three times daily.  Do not drink extra fluids with this dose and do not drink fluids for one hour afterwards.  When stools become more formed, may take Imodium (loperamide) once or twice daily to decrease stool frequency.  °If symptoms become significantly worse during the night or over the weekend, proceed to the local emergency room. ° °

## 2014-12-08 NOTE — ED Provider Notes (Signed)
CSN: [id number]     Arrival date & time 12/08/14  1858 History   First MD Initiated Contact with Patient 12/08/14 1935     Chief Complaint  Patient presents with  . Generalized Body Aches  . Diarrhea  . Cough     HPI Comments: Three days ago patient developed fatigue, headache, nausea, and watery diarrhea.  Yesterday he developed myalgias, fever/chills/sweats, sinus congestion, and non productive cough.  His nausea and watery diarrhea have persisted. Denies recent foreign travel, or drinking untreated water in a wilderness environment.  He denies recent antibiotic use.  His wife was treated for influenza this week.  The history is provided by the patient and the spouse.    Past Medical History  Diagnosis Date  . Hypothyroid 09/21/2012   Past Surgical History  Procedure Laterality Date  . Cataract extraction    . Appendectomy    . Hernia repair     Family History  Problem Relation Age of Onset  . Colon cancer Sister   . Diabetes Brother    History  Substance Use Topics  . Smoking status: Never Smoker   . Smokeless tobacco: Never Used  . Alcohol Use: Yes    Review of Systems + sore throat + hoarseness + cough No pleuritic pain No wheezing + nasal congestion + post-nasal drainage No sinus pain/pressure No itchy/red eyes ? earache No hemoptysis + SOB + fever, + chills + nausea No vomiting No abdominal pain + diarrhea No urinary symptoms No skin rash + fatigue + myalgias + headache Used OTC meds without relief  Allergies  Review of patient's allergies indicates no known allergies.  Home Medications   Prior to Admission medications   Medication Sig Start Date End Date Taking? Authorizing Provider  benzonatate (TESSALON) 200 MG capsule Take 1 capsule (200 mg total) by mouth at bedtime. Take as needed for cough 12/08/14   Kandra Nicolas, MD  ondansetron (ZOFRAN ODT) 4 MG disintegrating tablet Take one tab by mouth Q6hr prn nausea 12/08/14   Kandra Nicolas, MD   oseltamivir (TAMIFLU) 75 MG capsule Take 1 capsule (75 mg total) by mouth every 12 (twelve) hours. 12/08/14   Kandra Nicolas, MD  thyroid (NATURE-THROID) 65 MG tablet TAKE 1 TABLET (65 MG TOTAL) BY MOUTH DAILY. 07/26/14   Sean Hommel, DO   BP 109/73 mmHg  Pulse 113  Temp(Src) 98.9 F (37.2 C) (Oral)  Ht 6\' 1"  (1.854 m)  Wt 197 lb (89.359 kg)  BMI 26.00 kg/m2  SpO2 97% Physical Exam Nursing notes and Vital Signs reviewed. Appearance:  Patient appears stated age, and in no acute distress Eyes:  Pupils are equal, round, and reactive to light and accomodation.  Extraocular movement is intact.  Conjunctivae are not inflamed  Ears:  Canals normal.  Tympanic membranes normal.  Nose:  Mildly congested turbinates.  No sinus tenderness.  Pharynx:  Normal; moist mucous membranes  Neck:  Supple.  Tender enlarged posterior nodes are palpated bilaterally  Lungs:  Clear to auscultation.  Breath sounds are equal.  Heart:  Regular rate and rhythm without murmurs, rubs, or gallops.  Abdomen:  Nontender without masses or hepatosplenomegaly.  Bowel sounds are present and increased.  No CVA or flank tenderness.  Extremities:  No edema.  No calf tenderness Skin:  No rash present.   ED Course  Procedures  none     MDM   1. Influenza-like illness   2. Nausea without vomiting; ?viral gastroenteritis  3. Diarrhea  Administered Zofran ODT 4mg  PO.  Given Rx for same. Begin Tamiflu. Begin clear liquids (Pedialyte while having diarrhea) until improved, then advance to a [employer] (Bananas, Rice, Applesauce, Toast).  Then gradually resume a regular diet when tolerated.  Avoid milk products until well.  To decrease diarrhea, mix one teaspoon Citrucel (methylcellulose) in 2 oz water and drink one to three times daily.  Do not drink extra fluids with this dose and do not drink fluids for one hour afterwards.  When stools become more formed, may take Imodium (loperamide) once or twice daily to decrease stool  frequency.  If symptoms become significantly worse during the night or over the weekend, proceed to the local emergency room.  Followup with Family Doctor if not improved in about 4 days.    Kandra Nicolas, MD 12/08/14 2006

## 2014-12-11 ENCOUNTER — Encounter: Payer: Self-pay | Admitting: Physician Assistant

## 2014-12-11 ENCOUNTER — Ambulatory Visit (INDEPENDENT_AMBULATORY_CARE_PROVIDER_SITE_OTHER): Payer: Commercial Managed Care - HMO | Admitting: Physician Assistant

## 2014-12-11 VITALS — BP 106/69 | HR 73 | Ht 73.0 in | Wt 196.0 lb

## 2014-12-11 DIAGNOSIS — R197 Diarrhea, unspecified: Secondary | ICD-10-CM | POA: Diagnosis not present

## 2014-12-11 DIAGNOSIS — J209 Acute bronchitis, unspecified: Secondary | ICD-10-CM | POA: Diagnosis not present

## 2014-12-11 DIAGNOSIS — J069 Acute upper respiratory infection, unspecified: Secondary | ICD-10-CM

## 2014-12-11 MED ORDER — AZITHROMYCIN 250 MG PO TABS: ORAL_TABLET | ORAL | Status: DC

## 2014-12-11 MED ORDER — HYDROCODONE-HOMATROPINE 5-1.5 MG/5ML PO SYRP: 5.0000 mL | ORAL_SOLUTION | Freq: Every evening | ORAL | Status: DC | PRN

## 2014-12-11 NOTE — Patient Instructions (Signed)
Mucinex 1-2 capsules twice a day.   zpak if not improving in 24-48 hours.   Hycodan for cough at bedtime.

## 2014-12-11 NOTE — Progress Notes (Signed)
   Subjective:    Patient ID: Joel Hopkins, male    DOB: January 17, 1946, 69 y.o.   MRN: [id number]  HPI  Pt presents to the clinic with his wife to follow up after dx of influenza like illness on 12/08/14. 2 days of tamiflu left. He also had some diarrhea and GI concerns. He has kept to [employer] and stools have improved. No more nausea. Body aches have resolved. No ST, ear pain. Or sinus pressure. He does have a slightly productive cough with some mild chest tightness. No wheezing. Not taking anything OTC. No fever, chills, n/v/d. Feeling better everyday.    Review of Systems  All other systems reviewed and are negative.      Objective:   Physical Exam  Constitutional: He is oriented to person, place, and time. He appears well-developed and well-nourished.  HENT:  Head: Normocephalic and atraumatic.  Right Ear: External ear normal.  Left Ear: External ear normal.  Nose: Nose normal.  Mouth/Throat: Oropharynx is clear and moist.  TM's clear.  No sinus pressure to palpation.    Eyes: Conjunctivae are normal. Right eye exhibits no discharge. Left eye exhibits no discharge.  Neck: Normal range of motion. Neck supple.  Cardiovascular: Normal rate, regular rhythm and normal heart sounds.   Pulmonary/Chest: Effort normal and breath sounds normal. He has no wheezes.  Lymphadenopathy:    He has no cervical adenopathy.  Neurological: He is alert and oriented to person, place, and time.  Skin: Skin is dry.  Psychiatric: He has a normal mood and affect. His behavior is normal.          Assessment & Plan:  Acute bronchitis/acute upper respiratory infection- lungs sounded clear. I feel like symptoms are still likely viral. Start mucinex 1-2 tablets every 12 hours. If not improving or worsening then printed rx for zpak. Hycodan given for cough at bedtime and tessalon pearle for day time cough. Follow up as needed.   Diarrhea- resolved. Continue BRAT diet and advance foods slowly.

## 2014-12-25 ENCOUNTER — Encounter: Payer: Self-pay | Admitting: Family Medicine

## 2014-12-25 ENCOUNTER — Ambulatory Visit (INDEPENDENT_AMBULATORY_CARE_PROVIDER_SITE_OTHER): Payer: Commercial Managed Care - HMO | Admitting: Family Medicine

## 2014-12-25 VITALS — BP 105/68 | HR 71 | Temp 97.8°F | Wt 195.0 lb

## 2014-12-25 DIAGNOSIS — R05 Cough: Secondary | ICD-10-CM

## 2014-12-25 DIAGNOSIS — E039 Hypothyroidism, unspecified: Secondary | ICD-10-CM | POA: Diagnosis not present

## 2014-12-25 DIAGNOSIS — R059 Cough, unspecified: Secondary | ICD-10-CM

## 2014-12-25 MED ORDER — PREDNISONE 20 MG PO TABS: ORAL_TABLET | ORAL | Status: AC

## 2014-12-25 NOTE — Progress Notes (Signed)
CC: Joel Hopkins is a 69 y.o. male is here for Fatigue   Subjective: HPI:  3 weeks now has been experiencing a cough was originally productive but now nonproductive. Originally company by fever, fatigue, body aches however this resolved after taking azithromycin earlier this month. Cough is persistent, interfering with sleep, but slightly improved with hydrocodone cough syrup. No benefit from Mucinex or any over-the-counter medications.  Currently denies fevers, chills, shortness of breath, wheezing, nor chest pain.   Follow-up hypothyroidism: Continues to take 65 mg of nature thyroid. No unintentional weight loss or gain but he has noticed that he's been moderately fatigued ever since his illness 2 weeks ago.   Review Of Systems Outlined In HPI  Past Medical History  Diagnosis Date  . Hypothyroid 09/21/2012    Past Surgical History  Procedure Laterality Date  . Cataract extraction    . Appendectomy    . Hernia repair     Family History  Problem Relation Age of Onset  . Colon cancer Sister   . Diabetes Brother     History   Social History  . Marital Status: Married    Spouse Name: N/A  . Number of Children: N/A  . Years of Education: N/A   Occupational History  . Not on file.   Social History Main Topics  . Smoking status: Never Smoker   . Smokeless tobacco: Never Used  . Alcohol Use: Yes  . Drug Use: No  . Sexual Activity: Yes   Other Topics Concern  . Not on file   Social History Narrative     Objective: BP 105/68 mmHg  Pulse 71  Temp(Src) 97.8 F (36.6 C) (Oral)  Wt 195 lb (88.451 kg)  SpO2 95%  General: Alert and Oriented, No Acute Distress HEENT: Pupils equal, round, reactive to light. Conjunctivae clear.  External ears unremarkable, canals clear with intact TMs with appropriate landmarks.  Middle ear appears open without effusion. Pink inferior turbinates.  Moist mucous membranes, pharynx without inflammation nor lesions.  Neck supple without  palpable lymphadenopathy nor abnormal masses. Lungs: Clear to auscultation bilaterally, no wheezing/ronchi/rales.  Comfortable work of breathing. Good air movement. Cardiac: Regular rate and rhythm. Normal S1/S2.  No murmurs, rubs, nor gallops.   Extremities: No peripheral edema.  Strong peripheral pulses.  Mental Status: No depression, anxiety, nor agitation. Skin: Warm and dry.  Assessment & Plan: Joel Hopkins was seen today for fatigue.  Diagnoses and all orders for this visit:  Hypothyroidism, unspecified hypothyroidism type Orders: -     TSH  Cough Orders: -     predniSONE (DELTASONE) 20 MG tablet; Three tabs daily days 1-3, two tabs daily days 4-6, one tab daily days 7-9, half tab daily days 10-13.   Hypothyroidism: Due for TSH given recent fatigue Cough: Reassurance provided that there is no signs of any active infection. This is most likely post viral cough that can last 3-4 weeks after an illness. He wants to take a aggressive approach to try and relieve his cough therefore start prednisone taper.  Return if symptoms worsen or fail to improve.

## 2014-12-26 ENCOUNTER — Other Ambulatory Visit: Payer: Self-pay | Admitting: *Deleted

## 2014-12-26 LAB — TSH: TSH: 2.488 u[IU]/mL (ref 0.350–4.500)

## 2014-12-26 MED ORDER — THYROID 65 MG PO TABS: ORAL_TABLET | ORAL | Status: DC

## 2015-01-27 ENCOUNTER — Other Ambulatory Visit: Payer: Self-pay | Admitting: Family Medicine

## 2015-02-13 ENCOUNTER — Telehealth: Payer: Self-pay | Admitting: *Deleted

## 2015-02-13 DIAGNOSIS — M542 Cervicalgia: Secondary | ICD-10-CM

## 2015-02-13 DIAGNOSIS — M5412 Radiculopathy, cervical region: Secondary | ICD-10-CM

## 2015-02-13 DIAGNOSIS — M47812 Spondylosis without myelopathy or radiculopathy, cervical region: Secondary | ICD-10-CM

## 2015-02-13 NOTE — Telephone Encounter (Signed)
Caryl Pina from [facility] has question in re [place]. [phone]

## 2015-02-13 NOTE — Telephone Encounter (Signed)
referral

## 2015-02-15 DIAGNOSIS — F0781 Postconcussional syndrome: Secondary | ICD-10-CM | POA: Diagnosis not present

## 2015-02-15 DIAGNOSIS — G5601 Carpal tunnel syndrome, right upper limb: Secondary | ICD-10-CM | POA: Diagnosis not present

## 2015-02-15 DIAGNOSIS — G5602 Carpal tunnel syndrome, left upper limb: Secondary | ICD-10-CM | POA: Diagnosis not present

## 2015-02-15 DIAGNOSIS — M542 Cervicalgia: Secondary | ICD-10-CM | POA: Diagnosis not present

## 2015-02-15 DIAGNOSIS — G44219 Episodic tension-type headache, not intractable: Secondary | ICD-10-CM | POA: Diagnosis not present

## 2015-03-08 DIAGNOSIS — R42 Dizziness and giddiness: Secondary | ICD-10-CM | POA: Diagnosis not present

## 2015-03-13 DIAGNOSIS — R42 Dizziness and giddiness: Secondary | ICD-10-CM | POA: Diagnosis not present

## 2015-03-27 DIAGNOSIS — R42 Dizziness and giddiness: Secondary | ICD-10-CM | POA: Diagnosis not present

## 2015-03-29 DIAGNOSIS — R42 Dizziness and giddiness: Secondary | ICD-10-CM | POA: Diagnosis not present

## 2015-04-23 DIAGNOSIS — R42 Dizziness and giddiness: Secondary | ICD-10-CM | POA: Diagnosis not present

## 2015-04-25 DIAGNOSIS — G5601 Carpal tunnel syndrome, right upper limb: Secondary | ICD-10-CM | POA: Diagnosis not present

## 2015-04-25 DIAGNOSIS — R201 Hypoesthesia of skin: Secondary | ICD-10-CM | POA: Diagnosis not present

## 2015-04-25 DIAGNOSIS — G5602 Carpal tunnel syndrome, left upper limb: Secondary | ICD-10-CM | POA: Diagnosis not present

## 2015-04-25 DIAGNOSIS — M5481 Occipital neuralgia: Secondary | ICD-10-CM | POA: Diagnosis not present

## 2015-04-25 DIAGNOSIS — M542 Cervicalgia: Secondary | ICD-10-CM | POA: Diagnosis not present

## 2015-05-10 DIAGNOSIS — R42 Dizziness and giddiness: Secondary | ICD-10-CM | POA: Diagnosis not present

## 2015-05-14 ENCOUNTER — Encounter: Payer: Self-pay | Admitting: Family Medicine

## 2015-05-14 DIAGNOSIS — H01139 Eczematous dermatitis of unspecified eye, unspecified eyelid: Secondary | ICD-10-CM

## 2015-05-28 DIAGNOSIS — R42 Dizziness and giddiness: Secondary | ICD-10-CM | POA: Diagnosis not present

## 2015-05-29 DIAGNOSIS — L820 Inflamed seborrheic keratosis: Secondary | ICD-10-CM | POA: Diagnosis not present

## 2015-05-29 DIAGNOSIS — Z08 Encounter for follow-up examination after completed treatment for malignant neoplasm: Secondary | ICD-10-CM | POA: Diagnosis not present

## 2015-05-29 DIAGNOSIS — Z85828 Personal history of other malignant neoplasm of skin: Secondary | ICD-10-CM | POA: Diagnosis not present

## 2015-06-15 DIAGNOSIS — R42 Dizziness and giddiness: Secondary | ICD-10-CM | POA: Diagnosis not present

## 2015-06-15 DIAGNOSIS — M542 Cervicalgia: Secondary | ICD-10-CM | POA: Diagnosis not present

## 2015-06-22 DIAGNOSIS — R42 Dizziness and giddiness: Secondary | ICD-10-CM | POA: Diagnosis not present

## 2015-06-22 DIAGNOSIS — M542 Cervicalgia: Secondary | ICD-10-CM | POA: Diagnosis not present

## 2015-06-29 DIAGNOSIS — M542 Cervicalgia: Secondary | ICD-10-CM | POA: Diagnosis not present

## 2015-06-29 DIAGNOSIS — R42 Dizziness and giddiness: Secondary | ICD-10-CM | POA: Diagnosis not present

## 2015-07-06 DIAGNOSIS — R42 Dizziness and giddiness: Secondary | ICD-10-CM | POA: Diagnosis not present

## 2015-07-06 DIAGNOSIS — M542 Cervicalgia: Secondary | ICD-10-CM | POA: Diagnosis not present

## 2015-07-15 ENCOUNTER — Encounter: Payer: Self-pay | Admitting: Family Medicine

## 2015-07-16 ENCOUNTER — Ambulatory Visit (INDEPENDENT_AMBULATORY_CARE_PROVIDER_SITE_OTHER): Payer: Commercial Managed Care - HMO | Admitting: Osteopathic Medicine

## 2015-07-16 ENCOUNTER — Encounter: Payer: Self-pay | Admitting: Osteopathic Medicine

## 2015-07-16 VITALS — BP 131/65 | HR 79 | Temp 98.1°F | Ht 73.0 in | Wt 200.0 lb

## 2015-07-16 DIAGNOSIS — J329 Chronic sinusitis, unspecified: Secondary | ICD-10-CM | POA: Diagnosis not present

## 2015-07-16 DIAGNOSIS — B9689 Other specified bacterial agents as the cause of diseases classified elsewhere: Secondary | ICD-10-CM

## 2015-07-16 DIAGNOSIS — A499 Bacterial infection, unspecified: Secondary | ICD-10-CM

## 2015-07-16 MED ORDER — AMOXICILLIN-POT CLAVULANATE 875-125 MG PO TABS: 1.0000 | ORAL_TABLET | Freq: Two times a day (BID) | ORAL | Status: DC

## 2015-07-16 NOTE — Patient Instructions (Addendum)
If you are not feeling better in the next 5-7 days, please come back to the office for further evaluation. We may need to try different antibiotics or imaging of the sinuses with a CAT scan. You may take over-the-counter ibuprofen, Tylenol, other anti-inflammatory as needed for headache/pain.  Your headache becomes worse, if vision changes develop, or if you notice a rash, please seek medical care right away.

## 2015-07-17 NOTE — Progress Notes (Signed)
HPI: Joel Hopkins is a 69 y.o. male who presents to [facility]  today for chief complaint of:  Chief Complaint  Patient presents with  . Headache    . Location: R face/head and behind eye . Quality: soreness, tender . Severity: mild to moderate . Duration: 3 weeks . Timing: constant . Modifying factors: wife gave him meds, not sure what ones . Assoc signs/symptoms: sinus pressure, ear pressure and occasional drainage from ear, no burning pain or vision changes   Past medical, social and family history reviewed: Past Medical History  Diagnosis Date  . Hypothyroid 09/21/2012   Past Surgical History  Procedure Laterality Date  . Cataract extraction    . Appendectomy    . Hernia repair     Social History  Substance Use Topics  . Smoking status: Never Smoker   . Smokeless tobacco: Never Used  . Alcohol Use: Yes   Family History  Problem Relation Age of Onset  . Colon cancer Sister   . Diabetes Brother     Current Outpatient Prescriptions  Medication Sig Dispense Refill  . thyroid (NATURE-THROID) 65 MG tablet TAKE 1 TABLET (65 MG TOTAL) BY MOUTH DAILY. 30 tablet 5  . amoxicillin-clavulanate (AUGMENTIN) 875-125 MG tablet Take 1 tablet by mouth 2 (two) times daily. 14 tablet 0   No current facility-administered medications for this visit.   No Known Allergies    Review of Systems: CONSTITUTIONAL: Neg fever/chills, no unintentional weight changes HEAD/EYES/EARS/NOSE/THROAT: No vision change or hearing change, no sore throat, (+) sinus pressure and headache as above CARDIAC: No chest pain/pressure/palpitations, no orthopnea RESPIRATORY: No cough/shortness of breath/wheeze NEUROLOGIC: No weakness/dizzines/slurred speech    Exam:  BP 131/65 mmHg  Pulse 79  Temp(Src) 98.1 F (36.7 C) (Oral)  Ht 6\' 1"  (1.854 m)  Wt 200 lb (90.719 kg)  BMI 26.39 kg/m2 Constitutional: VSS, see above. General Appearance: alert, well-developed,  well-nourished, NAD Eyes: Normal lids and conjunctive, non-icteric sclera, PERRLA Ears, Nose, Mouth, Throat: Normal external inspection ears/nares/mouth/lips/gums, Normal TM bilaterally, MMM, posterior pharynx without erythema/exudate Head: (+) tenderness R frontal sinus area Neck: No masses, trachea midline. No thyroid enlargement/tenderness/mass appreciated Respiratory: Normal respiratory effort. no wheeze/rhonchi/rales Cardiovascular: S1/S2 normal, no murmur/rub/gallop auscultated. RRR.   Neurological: No cranial nerve deficit on limited exam. Motor and sensation intact and symmetric   No results found for this or any previous visit (from the past 72 hour(s)).    ASSESSMENT/PLAN:  Bacterial sinusitis - Plan: amoxicillin-clavulanate (AUGMENTIN) 875-125 MG tablet   RTC if not better, may need to consider sinus or other imaging if doesn't improve on abx, seek attention immediately if any vision changes or rash develops.

## 2015-07-19 ENCOUNTER — Encounter: Payer: Self-pay | Admitting: Family Medicine

## 2015-07-23 DIAGNOSIS — R51 Headache: Secondary | ICD-10-CM | POA: Diagnosis not present

## 2015-07-23 DIAGNOSIS — R269 Unspecified abnormalities of gait and mobility: Secondary | ICD-10-CM | POA: Diagnosis not present

## 2015-07-23 DIAGNOSIS — R42 Dizziness and giddiness: Secondary | ICD-10-CM | POA: Diagnosis not present

## 2015-07-25 DIAGNOSIS — M542 Cervicalgia: Secondary | ICD-10-CM | POA: Diagnosis not present

## 2015-07-25 DIAGNOSIS — E538 Deficiency of other specified B group vitamins: Secondary | ICD-10-CM | POA: Diagnosis not present

## 2015-07-25 DIAGNOSIS — G5601 Carpal tunnel syndrome, right upper limb: Secondary | ICD-10-CM | POA: Diagnosis not present

## 2015-07-25 DIAGNOSIS — E531 Pyridoxine deficiency: Secondary | ICD-10-CM | POA: Diagnosis not present

## 2015-07-25 DIAGNOSIS — G5602 Carpal tunnel syndrome, left upper limb: Secondary | ICD-10-CM | POA: Diagnosis not present

## 2015-07-25 DIAGNOSIS — M5412 Radiculopathy, cervical region: Secondary | ICD-10-CM | POA: Diagnosis not present

## 2015-07-25 DIAGNOSIS — R5383 Other fatigue: Secondary | ICD-10-CM | POA: Diagnosis not present

## 2015-07-25 DIAGNOSIS — R201 Hypoesthesia of skin: Secondary | ICD-10-CM | POA: Diagnosis not present

## 2015-09-09 ENCOUNTER — Other Ambulatory Visit: Payer: Self-pay | Admitting: Family Medicine

## 2015-10-05 ENCOUNTER — Encounter: Payer: Self-pay | Admitting: Osteopathic Medicine

## 2015-10-05 ENCOUNTER — Ambulatory Visit (INDEPENDENT_AMBULATORY_CARE_PROVIDER_SITE_OTHER): Payer: Commercial Managed Care - HMO | Admitting: Osteopathic Medicine

## 2015-10-05 VITALS — BP 112/64 | HR 84 | Temp 98.3°F | Ht 73.0 in | Wt 199.0 lb

## 2015-10-05 DIAGNOSIS — H60393 Other infective otitis externa, bilateral: Secondary | ICD-10-CM

## 2015-10-05 DIAGNOSIS — J0120 Acute ethmoidal sinusitis, unspecified: Secondary | ICD-10-CM | POA: Diagnosis not present

## 2015-10-05 DIAGNOSIS — J069 Acute upper respiratory infection, unspecified: Secondary | ICD-10-CM | POA: Diagnosis not present

## 2015-10-05 LAB — POCT INFLUENZA A/B: INFLUENZA A, POC: NEGATIVE

## 2015-10-05 MED ORDER — AMOXICILLIN-POT CLAVULANATE 875-125 MG PO TABS: 1.0000 | ORAL_TABLET | Freq: Two times a day (BID) | ORAL | Status: DC

## 2015-10-05 MED ORDER — OFLOXACIN 0.3 % OT SOLN: 5.0000 [drp] | Freq: Every day | OTIC | Status: DC

## 2015-10-05 NOTE — Progress Notes (Signed)
HPI: Joel Hopkins is a 69 y.o. male who presents to [facility]  today for chief complaint of:  Chief Complaint  Patient presents with  . Cough    . Location: sinuses, head/neck, sore ears . Quality: pressure, pain . Severity: marked . Duration: 2 - 3 days . Context: yes sick contacts, yes recent travel . Modifying factors: has tried the following OTC medications: sudafed, zyrtec  without relief . Assoc signs/symptoms: subjective fever/chills, no productive cough, Yes  body aches, No  GI upset   Past medical, social and family history reviewed: Past Medical History  Diagnosis Date  . Hypothyroid 09/21/2012   Past Surgical History  Procedure Laterality Date  . Cataract extraction    . Appendectomy    . Hernia repair     Social History  Substance Use Topics  . Smoking status: Never Smoker   . Smokeless tobacco: Never Used  . Alcohol Use: Yes   Family History  Problem Relation Age of Onset  . Colon cancer Sister   . Diabetes Brother     Current Outpatient Prescriptions  Medication Sig Dispense Refill  . thyroid (NATURE-THROID) 65 MG tablet Take 1 tablet (65 mg total) by mouth daily. Need follow up appointment with PCP for more refills 30 tablet 0   No current facility-administered medications for this visit.   No Known Allergies    Review of Systems: CONSTITUTIONAL: yes fever/chills HEAD/EYES/EARS/NOSE/THROAT: yes headache, no vision change or hearing change, yes sore throat CARDIAC: No chest pain/pressure/palpitations, no orthopnea RESPIRATORY: yes cough, no shortness of breath GASTROINTESTINAL: no nausea, no vomiting, no abdominal pain/blood in stool/diarrhea/constipation MUSCULOSKELETAL: yes myalgia/arthralgia   Exam:  BP 112/64 mmHg  Pulse 84  Temp(Src) 98.3 F (36.8 C) (Oral)  Ht 6\' 1"  (1.854 m)  Wt 199 lb (90.266 kg)  BMI 26.26 kg/m2 Constitutional: VSS, see above. General Appearance: alert, well-developed,  well-nourished, NAD Eyes: Normal lids and conjunctive, non-icteric sclera, PERRLA Ears, Nose, Mouth, Throat: Normal external inspection ears/nares/mouth/lips/gums, normal TM, (+) canals (+)d/c c/w otitis externa, MMM; posterior pharynx with erythema, without exudate Neck: No masses, trachea midline. No thyroid enlargement/tenderness/mass appreciated, normal lymph nodes Respiratory: Normal respiratory effort. No  wheeze/rhonchi/rales Cardiovascular: S1/S2 normal, no murmur/rub/gallop auscultated. RRR. No carotid bruit or JVD. No lower extremity edema.   Results for orders placed or performed in visit on 10/05/15 (from the past 72 hour(s))  POCT Influenza A/B     Status: None   Collection Time: 10/05/15 11:15 AM  Result Value Ref Range   Influenza A, POC Negative Negative   Influenza B, POC  Negative      ASSESSMENT/PLAN: Otitis externa with possible concurrent sinus infection though most likely viral sinusitis, we'll treat as below, ER precautions reviewed with patient,  Otitis, externa, infective, bilateral - Plan: ofloxacin (FLOXIN) 0.3 % otic solution  Acute ethmoidal sinusitis, recurrence not specified - Plan: amoxicillin-clavulanate (AUGMENTIN) 875-125 MG tablet, POCT Influenza A/B    Return if symptoms worsen or fail to improve, and for routine care with PCP.

## 2015-10-11 ENCOUNTER — Encounter: Payer: Self-pay | Admitting: Family Medicine

## 2015-10-11 DIAGNOSIS — H9190 Unspecified hearing loss, unspecified ear: Secondary | ICD-10-CM

## 2015-10-11 DIAGNOSIS — M5412 Radiculopathy, cervical region: Secondary | ICD-10-CM

## 2015-10-11 NOTE — Telephone Encounter (Signed)
Yes Reubin Milan can you please process both requests?

## 2015-10-15 ENCOUNTER — Encounter: Payer: Self-pay | Admitting: Family Medicine

## 2015-10-15 ENCOUNTER — Ambulatory Visit (INDEPENDENT_AMBULATORY_CARE_PROVIDER_SITE_OTHER): Payer: Commercial Managed Care - HMO | Admitting: Family Medicine

## 2015-10-15 VITALS — BP 108/68 | HR 72 | Wt 200.0 lb

## 2015-10-15 DIAGNOSIS — E039 Hypothyroidism, unspecified: Secondary | ICD-10-CM

## 2015-10-15 DIAGNOSIS — Z23 Encounter for immunization: Secondary | ICD-10-CM

## 2015-10-15 DIAGNOSIS — H9193 Unspecified hearing loss, bilateral: Secondary | ICD-10-CM | POA: Diagnosis not present

## 2015-10-15 DIAGNOSIS — N183 Chronic kidney disease, stage 3 unspecified: Secondary | ICD-10-CM

## 2015-10-15 DIAGNOSIS — R972 Elevated prostate specific antigen [PSA]: Secondary | ICD-10-CM

## 2015-10-15 DIAGNOSIS — H9190 Unspecified hearing loss, unspecified ear: Secondary | ICD-10-CM | POA: Diagnosis not present

## 2015-10-15 DIAGNOSIS — Z0000 Encounter for general adult medical examination without abnormal findings: Secondary | ICD-10-CM

## 2015-10-15 MED ORDER — CELECOXIB 200 MG PO CAPS: 200.0000 mg | ORAL_CAPSULE | Freq: Two times a day (BID) | ORAL | Status: DC | PRN

## 2015-10-15 MED ORDER — THYROID 65 MG PO TABS: 65.0000 mg | ORAL_TABLET | Freq: Every day | ORAL | Status: DC

## 2015-10-15 MED ORDER — PNEUMOCOCCAL 13-VAL CONJ VACC IM SUSP
0.5000 mL | Freq: Once | INTRAMUSCULAR | Status: AC
  Administered 2015-10-15: 0.5 mL via INTRAMUSCULAR
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
## 2015-10-16 LAB — LIPID PANEL
CHOL/HDL RATIO: 4.5 ratio (ref <=5.0)
Cholesterol: 201 mg/dL — ABNORMAL HIGH (ref 125–200)
HDL: 45 mg/dL (ref >=40)
LDL CALC: 141 mg/dL — AB (ref <130)
TRIGLYCERIDES: 76 mg/dL (ref <150)
VLDL: 15 mg/dL (ref <30)

## 2015-10-16 LAB — COMPLETE METABOLIC PANEL WITH GFR
ALT: 15 U/L (ref 9–46)
AST: 15 U/L (ref 10–35)
Albumin: 3.7 g/dL (ref 3.6–5.1)
Alkaline Phosphatase: 57 U/L (ref 40–115)
BILIRUBIN TOTAL: 0.6 mg/dL (ref 0.2–1.2)
BUN: 20 mg/dL (ref 7–25)
CHLORIDE: 107 mmol/L (ref 98–110)
CO2: 24 mmol/L (ref 20–31)
Calcium: 8.8 mg/dL (ref 8.6–10.3)
Creat: 1.14 mg/dL (ref 0.70–1.25)
GFR, EST AFRICAN AMERICAN: 76 mL/min (ref >=60)
GFR, EST NON AFRICAN AMERICAN: 66 mL/min (ref >=60)
Glucose, Bld: 93 mg/dL (ref 65–99)
POTASSIUM: 4.2 mmol/L (ref 3.5–5.3)
SODIUM: 141 mmol/L (ref 135–146)
Total Protein: 5.9 g/dL — ABNORMAL LOW (ref 6.1–8.1)

## 2015-10-16 LAB — TSH: TSH: 4.855 u[IU]/mL — AB (ref 0.350–4.500)

## 2015-10-17 ENCOUNTER — Telehealth: Payer: Self-pay | Admitting: Family Medicine

## 2015-10-17 DIAGNOSIS — E039 Hypothyroidism, unspecified: Secondary | ICD-10-CM

## 2015-10-17 DIAGNOSIS — E785 Hyperlipidemia, unspecified: Secondary | ICD-10-CM | POA: Insufficient documentation

## 2015-10-17 LAB — CBC
HCT: 42.9 % (ref 39.0–52.0)
HEMOGLOBIN: 14.7 g/dL (ref 13.0–17.0)
MCH: 29.9 pg (ref 26.0–34.0)
MCHC: 34.3 g/dL (ref 30.0–36.0)
MCV: 87.4 fL (ref 78.0–100.0)
MPV: 11.6 fL (ref 8.6–12.4)
RBC: 4.91 MIL/uL (ref 4.22–5.81)
RDW: 13.3 % (ref 11.5–15.5)
WBC: 6.7 10*3/uL (ref 4.0–10.5)

## 2015-10-17 LAB — PSA: PSA: 5.02 ng/mL — AB (ref <=4.00)

## 2015-10-17 MED ORDER — ATORVASTATIN CALCIUM 10 MG PO TABS: 10.0000 mg | ORAL_TABLET | Freq: Every day | ORAL | Status: DC

## 2015-10-17 NOTE — Telephone Encounter (Signed)
Pt.notified

## 2015-10-17 NOTE — Telephone Encounter (Signed)
Will you please let patient know that his PSA prostate test is stable.  His blood sugar, kidney function, liver function, and thyroid function were normal.  His cholesterol is elevated to a degree that I would recommend starting a cholesterol lowering medication called atorvastatin that I've sent to his pharmacy.  It would be wise to recheck this in three months.

## 2015-11-01 DIAGNOSIS — F0781 Postconcussional syndrome: Secondary | ICD-10-CM | POA: Diagnosis not present

## 2015-11-01 DIAGNOSIS — G5603 Carpal tunnel syndrome, bilateral upper limbs: Secondary | ICD-10-CM | POA: Diagnosis not present

## 2015-11-01 DIAGNOSIS — M5481 Occipital neuralgia: Secondary | ICD-10-CM | POA: Diagnosis not present

## 2015-11-01 DIAGNOSIS — R9082 White matter disease, unspecified: Secondary | ICD-10-CM | POA: Diagnosis not present

## 2015-11-01 DIAGNOSIS — M5412 Radiculopathy, cervical region: Secondary | ICD-10-CM | POA: Diagnosis not present

## 2015-11-14 DIAGNOSIS — M50322 Other cervical disc degeneration at C5-C6 level: Secondary | ICD-10-CM | POA: Diagnosis not present

## 2015-11-14 DIAGNOSIS — M2578 Osteophyte, vertebrae: Secondary | ICD-10-CM | POA: Diagnosis not present

## 2015-11-14 DIAGNOSIS — M542 Cervicalgia: Secondary | ICD-10-CM | POA: Diagnosis not present

## 2015-12-13 DIAGNOSIS — M5412 Radiculopathy, cervical region: Secondary | ICD-10-CM | POA: Diagnosis not present

## 2015-12-13 DIAGNOSIS — M5481 Occipital neuralgia: Secondary | ICD-10-CM | POA: Diagnosis not present

## 2015-12-13 DIAGNOSIS — G44219 Episodic tension-type headache, not intractable: Secondary | ICD-10-CM | POA: Diagnosis not present

## 2015-12-13 DIAGNOSIS — G5602 Carpal tunnel syndrome, left upper limb: Secondary | ICD-10-CM | POA: Diagnosis not present

## 2015-12-13 DIAGNOSIS — R202 Paresthesia of skin: Secondary | ICD-10-CM | POA: Diagnosis not present

## 2015-12-13 DIAGNOSIS — F0781 Postconcussional syndrome: Secondary | ICD-10-CM | POA: Diagnosis not present

## 2015-12-13 DIAGNOSIS — G5601 Carpal tunnel syndrome, right upper limb: Secondary | ICD-10-CM | POA: Diagnosis not present

## 2016-01-04 ENCOUNTER — Other Ambulatory Visit: Payer: Self-pay | Admitting: Family Medicine

## 2016-01-04 DIAGNOSIS — Z0000 Encounter for general adult medical examination without abnormal findings: Secondary | ICD-10-CM

## 2016-01-08 DIAGNOSIS — Z6825 Body mass index (BMI) 25.0-25.9, adult: Secondary | ICD-10-CM | POA: Diagnosis not present

## 2016-01-08 DIAGNOSIS — M4802 Spinal stenosis, cervical region: Secondary | ICD-10-CM | POA: Diagnosis not present

## 2016-01-16 ENCOUNTER — Encounter: Payer: Self-pay | Admitting: Family Medicine

## 2016-01-16 DIAGNOSIS — M5412 Radiculopathy, cervical region: Secondary | ICD-10-CM

## 2016-01-29 ENCOUNTER — Encounter: Payer: Self-pay | Admitting: Family Medicine

## 2016-01-30 ENCOUNTER — Ambulatory Visit (INDEPENDENT_AMBULATORY_CARE_PROVIDER_SITE_OTHER): Payer: Commercial Managed Care - HMO | Admitting: Family Medicine

## 2016-01-30 ENCOUNTER — Other Ambulatory Visit: Payer: Self-pay | Admitting: Family Medicine

## 2016-01-30 ENCOUNTER — Encounter: Payer: Self-pay | Admitting: Family Medicine

## 2016-01-30 VITALS — BP 117/72 | HR 79 | Wt 199.0 lb

## 2016-01-30 DIAGNOSIS — L739 Follicular disorder, unspecified: Secondary | ICD-10-CM | POA: Diagnosis not present

## 2016-01-30 DIAGNOSIS — M7989 Other specified soft tissue disorders: Secondary | ICD-10-CM | POA: Diagnosis not present

## 2016-01-30 DIAGNOSIS — L309 Dermatitis, unspecified: Secondary | ICD-10-CM

## 2016-01-30 DIAGNOSIS — M25511 Pain in right shoulder: Secondary | ICD-10-CM

## 2016-01-30 DIAGNOSIS — M5412 Radiculopathy, cervical region: Secondary | ICD-10-CM | POA: Diagnosis not present

## 2016-01-30 MED ORDER — ZOSTER VACCINE LIVE 19400 UNT/0.65ML ~~LOC~~ SUSR: 0.6500 mL | Freq: Once | SUBCUTANEOUS | Status: DC

## 2016-01-30 MED ORDER — CLINDAMYCIN HCL 300 MG PO CAPS: 300.0000 mg | ORAL_CAPSULE | Freq: Three times a day (TID) | ORAL | Status: DC

## 2016-01-30 NOTE — Progress Notes (Signed)
CC: Joel Hopkins is a 70 y.o. male is here for folliculitis?   Subjective: HPI:  Follow-up cervical radiculopathy: He would like a second opinion on whether or not he is a good candidate for Surgery. A referral was placed yesterday but they've yet to hear back from that office about scheduling.  Complains of slowly enlarging and swelling of the pad of the right index finger. He had a foreign body removed about a year and a half ago however it seems to be still painful.   He complains of right shoulder pain with inability to comfortably put his hand behind his back or lift above his head. It's slowly been getting worse over the course of this last month. He denies any recent or remote injury. He denies any motor or sensory disturbances in the right upper extremity.  He's complaining of little red bumps in his right armpit that have been painful and present for the past week. They seem to be getting bigger. He denies fevers, chills nor skin changes elsewhere.  He needs a new referral to his dermatologist for follow-up on eczema  Review Of Systems Outlined In HPI  Past Medical History  Diagnosis Date  . Hypothyroid 09/21/2012    Past Surgical History  Procedure Laterality Date  . Cataract extraction    . Appendectomy    . Hernia repair     Family History  Problem Relation Age of Onset  . Colon cancer Sister   . Diabetes Brother     Social History   Social History  . Marital Status: Married    Spouse Name: N/A  . Number of Children: N/A  . Years of Education: N/A   Occupational History  . Not on file.   Social History Main Topics  . Smoking status: Never Smoker   . Smokeless tobacco: Never Used  . Alcohol Use: Yes  . Drug Use: No  . Sexual Activity: Yes   Other Topics Concern  . Not on file   Social History Narrative     Objective: BP 117/72 mmHg  Pulse 79  Wt 199 lb (90.266 kg)  General: Alert and Oriented, No Acute Distress HEENT: Pupils equal, round,  reactive to light. Conjunctivae clear. Moist mucous membranes Lungs: Clear to auscultation bilaterally, no wheezing/ronchi/rales.  Comfortable work of breathing. Good air movement. Cardiac: Regular rate and rhythm. Normal S1/S2.  No murmurs, rubs, nor gallops.   Right shoulder exam reveals full range of motion and strength in all planes of motion and with individual rotator cuff testing. No overlying redness warmth or swelling.  Neer's test positive.  Hawkins test positive. Empty can negative. Crossarm test negative. O'Brien's test negative. Apprehension test negative. Speed's test negative. Extremities: No peripheral edema.  Strong peripheral pulses.  Mental Status: No depression, anxiety, nor agitation. Skin: Warm and dry. Mild erythema at 5 separate hair follicles in the right axillary region. No signs of an abscess  Assessment & Plan: Leotha was seen today for folliculitis?.  Diagnoses and all orders for this visit:  Cervical radiculopathy  Swelling of finger, right -     Ambulatory referral to Hand Surgery  Right shoulder pain  Folliculitis -     clindamycin (CLEOCIN) 300 MG capsule; Take 1 capsule (300 mg total) by mouth 3 (three) times daily.  Eczema -     Ambulatory referral to Dermatology  Other orders -     Zoster Vaccine Live, PF, (ZOSTAVAX) 16109 UNT/0.65ML injection; Inject 19,400 Units into the skin once.  Cervical radiculopathy: Per his request referral has been placed to their preferred neurosurgeon Referring back to hand surgery for evaluation of swelling of the right index finger Follicular areas: Start clindamycin call on Monday if not improving He said he doesn't want to take pills to help with his right shoulder pain therefore in a shoulder injection was offered and he would like to pursue this. Subacromial Shoulder Injection Procedure Note  Pre-operative Diagnosis: right shoulder pain  Post-operative Diagnosis: same  Indications:  Pain  Anesthesia:topical cold spray  Procedure Details   Verbal consent was obtained for the procedure. The shoulder was prepped with Betadine and the skin was anesthetized. A 27 gauge needle was advanced into the subacromial space through posterior approach without difficulty  The space was then injected with 2 ml 1% lidocaine and 2 ml of triamcinolone (KENALOG) 40mg /ml. The injection site was cleansed with isopropyl alcohol and a dressing was applied.  Complications:  None; patient tolerated the procedure well.   Return in about 3 months (around 04/30/2016).

## 2016-01-31 ENCOUNTER — Encounter: Payer: Self-pay | Admitting: Family Medicine

## 2016-02-15 DIAGNOSIS — M5412 Radiculopathy, cervical region: Secondary | ICD-10-CM | POA: Diagnosis not present

## 2016-02-18 DIAGNOSIS — R2231 Localized swelling, mass and lump, right upper limb: Secondary | ICD-10-CM | POA: Diagnosis not present

## 2016-03-05 DIAGNOSIS — M4722 Other spondylosis with radiculopathy, cervical region: Secondary | ICD-10-CM | POA: Diagnosis not present

## 2016-03-05 DIAGNOSIS — M5412 Radiculopathy, cervical region: Secondary | ICD-10-CM | POA: Diagnosis not present

## 2016-03-17 ENCOUNTER — Ambulatory Visit (INDEPENDENT_AMBULATORY_CARE_PROVIDER_SITE_OTHER): Payer: Commercial Managed Care - HMO | Admitting: Osteopathic Medicine

## 2016-03-17 VITALS — BP 121/76 | HR 94 | Wt 197.0 lb

## 2016-03-17 DIAGNOSIS — R110 Nausea: Secondary | ICD-10-CM | POA: Diagnosis not present

## 2016-03-17 DIAGNOSIS — R42 Dizziness and giddiness: Secondary | ICD-10-CM | POA: Diagnosis not present

## 2016-03-17 DIAGNOSIS — R112 Nausea with vomiting, unspecified: Secondary | ICD-10-CM | POA: Diagnosis not present

## 2016-03-17 DIAGNOSIS — R51 Headache: Secondary | ICD-10-CM | POA: Diagnosis not present

## 2016-03-17 DIAGNOSIS — E039 Hypothyroidism, unspecified: Secondary | ICD-10-CM

## 2016-03-17 MED ORDER — PROMETHAZINE HCL 25 MG PO TABS: 25.0000 mg | ORAL_TABLET | Freq: Four times a day (QID) | ORAL | Status: DC | PRN

## 2016-03-17 MED ORDER — PROMETHAZINE HCL 25 MG/ML IJ SOLN
25.0000 mg | Freq: Once | INTRAMUSCULAR | Status: AC
  Administered 2016-03-17: 25 mg via INTRAMUSCULAR

## 2016-03-17 MED ORDER — MECLIZINE HCL 25 MG PO TABS: 25.0000 mg | ORAL_TABLET | Freq: Three times a day (TID) | ORAL | Status: DC | PRN

## 2016-03-17 MED ORDER — DIAZEPAM 5 MG PO TABS: 5.0000 mg | ORAL_TABLET | Freq: Four times a day (QID) | ORAL | Status: DC | PRN

## 2016-03-17 NOTE — Progress Notes (Signed)
HPI: Joel Hopkins is a 70 y.o. male who presents to [place] today for chief complaint of:  Chief Complaint  Patient presents with  . Dizziness  . Nausea    . Quality: room spinning,  . Severity: severe . Duration: 3 day and getting worse . Timing: worse with standing up and with any kind of head movement . Modifying factors: chronic cervical spine pain but this feels different . Assoc signs/symptoms: dizzy and nausea, headache and neck pain, low appetite, no chest pain, dry heaves but no vomiting, feels unsteady walking   Past medical, social and family history reviewed: Past Medical History  Diagnosis Date  . Hypothyroid 09/21/2012   Past Surgical History  Procedure Laterality Date  . Cataract extraction    . Appendectomy    . Hernia repair     Social History  Substance Use Topics  . Smoking status: Never Smoker   . Smokeless tobacco: Never Used  . Alcohol Use: Yes   Family History  Problem Relation Age of Onset  . Colon cancer Sister   . Diabetes Brother     Current Outpatient Prescriptions  Medication Sig Dispense Refill  . thyroid (NATURE-THROID) 65 MG tablet Take 1 tablet (65 mg total) by mouth daily. NEED FOLLOW UP APPOINTMENT FOR MORE REFILLS 30 tablet 0                           No current facility-administered medications for this visit.   No Known Allergies    Review of Systems: CONSTITUTIONAL:  No  fever, (+) chills, No recent illness, No unintentional weight changes HEAD/EYES/EARS/NOSE/THROAT: (+) headache, no vision change, no hearing change, No sore throat, No  sinus pressure CARDIAC: No  chest pain, No  pressure, No palpitations RESPIRATORY: No  cough, No  shortness of breath/wheeze GASTROINTESTINAL: (+) dry heaves and nausea, No  vomiting, No  abdominal pain, No  blood in stool, No  diarrhea, No  constipation  MUSCULOSKELETAL: (+) chronic myalgia/arthralgia worse in neck GENITOURINARY: No   incontinence,  NEUROLOGIC: No  weakness, (+) dizziness, No  slurred speech, no focal weakness   Exam:  BP 121/76 mmHg  Pulse 94  Wt 197 lb (89.359 kg)  Orthostatic VS for the past 24 hrs:  BP- Lying Pulse- Lying BP- Sitting Pulse- Sitting BP- Standing at 0 minutes Pulse- Standing at 0 minutes  03/17/16 1634 120/76 mmHg 90 128/68 mmHg 94 121/71 mmHg 77   Constitutional: VS see above. General Appearance: alert, well-developed, well-nourished, mild distress due to pain  Eyes: Normal lids and conjunctive, non-icteric sclera, PERRLA, EOMI Ears, Nose, Mouth, Throat: MMM, Normal external inspection ears/nares/mouth/lips/gums, TM normal bilaterally. Pharynx/tonsils no erythema, no exudate. Nasal mucosa normal.  Neck: No masses, trachea midline. No thyroid enlargement. No tenderness/mass appreciated. No lymphadenopathy Respiratory: Normal respiratory effort. no wheeze, no rhonchi, no rales Cardiovascular: S1/S2 normal, no murmur, no rub/gallop auscultated. RRR. No lower extremity edema. Gastrointestinal: Nontender, no masses Musculoskeletal: Gait no ataxia but patient appears unsteady  Neurological: No cranial nerve deficit on limited exam. Motor and sensation intact and symmetric. Dix-Hallpike negative for nystagmus, but (+) reproduction of vertigo/nausea symptoms with motion, normal hell-to-shin, normal finger-to-nose but he feels dizzy with these tests,  Skin: warm, dry, intact. No rash/ulcer.   Psychiatric: Normal judgment/insight. Normal mood and affect. Oriented x3.     EKG interpretation: Rate: 63  Rhythm: sinus No ST/T changes concerning for acute ischemia/infarct  No  prior EKG to compare   ASSESSMENT/PLAN: Patient declines transfer to the emergency room. Given severity and worsening of symptoms, in addition to severe headache, reasonable to get MRA for workup of possible cerebellar problem, no neurologic symptoms of cortical stroke. Symptomatic treatment as below with meclizine and  promethazine, Valium for severe symptoms not controlled by above medications. We were able to get him an MRA this evening. Doubt that labs would have high yield for cause of vertigo but due for thyroid recheck at any rate. Stroke precautions and reasons to go to emergency room were reviewed in detail with the patient and his wife.  Followup pending MRI results. I'm more suspicious for vestibular neuritis, no recent sinus issue or other upper respiratory illness, could also consider glaucoma but we can't test IOP here.   Addendum 03/18/2016 8:30 AM received call from radiology yesterday evening after hours, MRA was negative, radiology tech informed the patient per my instructions.   Postural dizziness - Plan: CBC with Differential/Platelet, COMPLETE METABOLIC PANEL WITH GFR, TSH, Magnesium, meclizine (ANTIVERT) 25 MG tablet, diazepam (VALIUM) 5 MG tablet, promethazine (PHENERGAN) 25 MG tablet, MR MRA HEAD W WO CONTRAST  Hypothyroidism, unspecified hypothyroidism type - Plan: TSH, MR MRA HEAD W WO CONTRAST  Nausea and vomiting, intractability of vomiting not specified, unspecified vomiting type - Plan: promethazine (PHENERGAN) 25 MG tablet, promethazine (PHENERGAN) injection 25 mg, MR MRA HEAD W WO CONTRAST   All questions were answered. Visit summary with medication list and pertinent instructions was printed for patient to review. ER/RTC precautions were reviewed with the patient. Return in about 1 week (around 03/24/2016), or sooner if needed, for RECHECK SYMPTOMS, GO OVER MRI RESULTS.

## 2016-03-17 NOTE — Patient Instructions (Signed)

## 2016-03-18 LAB — CBC WITH DIFFERENTIAL/PLATELET
BASOS PCT: 0 %
Basophils Absolute: 0 cells/uL (ref 0–200)
EOS ABS: 142 {cells}/uL (ref 15–500)
EOS PCT: 2 %
HCT: 42.8 % (ref 38.5–50.0)
Hemoglobin: 14.5 g/dL (ref 13.2–17.1)
LYMPHS PCT: 23 %
Lymphs Abs: 1633 cells/uL (ref 850–3900)
MCH: 30.7 pg (ref 27.0–33.0)
MCHC: 33.9 g/dL (ref 32.0–36.0)
MCV: 90.7 fL (ref 80.0–100.0)
MONOS PCT: 5 %
MPV: 11.1 fL (ref 7.5–12.5)
Monocytes Absolute: 355 cells/uL (ref 200–950)
NEUTROS ABS: 4970 {cells}/uL (ref 1500–7800)
Neutrophils Relative %: 70 %
Platelets: 83 10*3/uL — ABNORMAL LOW (ref 140–400)
RBC: 4.72 MIL/uL (ref 4.20–5.80)
RDW: 13.6 % (ref 11.0–15.0)
WBC: 7.1 10*3/uL (ref 3.8–10.8)

## 2016-03-18 LAB — COMPLETE METABOLIC PANEL WITH GFR
ALBUMIN: 3.8 g/dL (ref 3.6–5.1)
ALK PHOS: 50 U/L (ref 40–115)
ALT: 16 U/L (ref 9–46)
AST: 28 U/L (ref 10–35)
BILIRUBIN TOTAL: 0.6 mg/dL (ref 0.2–1.2)
BUN: 13 mg/dL (ref 7–25)
CO2: 29 mmol/L (ref 20–31)
CREATININE: 1.22 mg/dL (ref 0.70–1.25)
Calcium: 9.1 mg/dL (ref 8.6–10.3)
Chloride: 102 mmol/L (ref 98–110)
GFR, EST NON AFRICAN AMERICAN: 61 mL/min (ref >=60)
GFR, Est African American: 70 mL/min (ref >=60)
GLUCOSE: 115 mg/dL — AB (ref 65–99)
Potassium: 4.2 mmol/L (ref 3.5–5.3)
SODIUM: 139 mmol/L (ref 135–146)
TOTAL PROTEIN: 5.9 g/dL — AB (ref 6.1–8.1)

## 2016-03-18 LAB — MAGNESIUM: Magnesium: 2 mg/dL (ref 1.5–2.5)

## 2016-03-18 LAB — TSH: TSH: 2.63 mIU/L (ref 0.40–4.50)

## 2016-03-18 NOTE — Progress Notes (Addendum)
Called patient today to check up on his symptoms - dizziness still there but better, headache is pretty much gone, balance is a bit of an issue, medications have been helping. RTC/ER precautions reiterated and all questions answered. MRI report sent to scan:   "Impression: 1. Normal intracranial MR angiography. 2. No evidence of acute infarct. Please note that this examination does not fully evaluate the brain parenchyma, if there is continued concern for intracranial pathology consider MRI brain without contrast Read by Dr. Glennie Hawk, 03/17/2016, 7:52 PM"

## 2016-03-20 DIAGNOSIS — L820 Inflamed seborrheic keratosis: Secondary | ICD-10-CM | POA: Diagnosis not present

## 2016-03-20 DIAGNOSIS — L249 Irritant contact dermatitis, unspecified cause: Secondary | ICD-10-CM | POA: Diagnosis not present

## 2016-03-20 DIAGNOSIS — Z85820 Personal history of malignant melanoma of skin: Secondary | ICD-10-CM | POA: Diagnosis not present

## 2016-03-20 DIAGNOSIS — Z08 Encounter for follow-up examination after completed treatment for malignant neoplasm: Secondary | ICD-10-CM | POA: Diagnosis not present

## 2016-03-24 ENCOUNTER — Ambulatory Visit (INDEPENDENT_AMBULATORY_CARE_PROVIDER_SITE_OTHER): Payer: Commercial Managed Care - HMO | Admitting: Osteopathic Medicine

## 2016-03-24 ENCOUNTER — Encounter: Payer: Self-pay | Admitting: Osteopathic Medicine

## 2016-03-24 VITALS — BP 117/73 | HR 69 | Ht 73.0 in | Wt 198.0 lb

## 2016-03-24 DIAGNOSIS — E039 Hypothyroidism, unspecified: Secondary | ICD-10-CM | POA: Diagnosis not present

## 2016-03-24 DIAGNOSIS — R42 Dizziness and giddiness: Secondary | ICD-10-CM | POA: Diagnosis not present

## 2016-03-24 DIAGNOSIS — D696 Thrombocytopenia, unspecified: Secondary | ICD-10-CM | POA: Diagnosis not present

## 2016-03-24 NOTE — Patient Instructions (Signed)
If symptoms come back/get worse, please make an appointment to follow up! Otherwise keep your appointment with Dr. Ileene Rubens in July.

## 2016-03-24 NOTE — Progress Notes (Signed)
HPI: Joel Hopkins is a 70 y.o. male who presents to [name] today for chief complaint of:  Chief Complaint  Patient presents with  . Follow-up     . Context: here to follow up after acute visit for dizziness and severe headache, MRA negative, labs no concerns except chornic low Plt, TSH normal  . Quality: dizziness is better, mostly resolved. No headache.  . Severity: improved . Modifying factors: meds as below were helpful, he didn't really need any of the Valium.  . Assoc signs/symptoms: no headache, no vision change, no hearing change, no nausea     Past medical, social and family history reviewed: Past Medical History  Diagnosis Date  . Hypothyroid 09/21/2012   Past Surgical History  Procedure Laterality Date  . Cataract extraction    . Appendectomy    . Hernia repair     Social History  Substance Use Topics  . Smoking status: Never Smoker   . Smokeless tobacco: Never Used  . Alcohol Use: Yes   Family History  Problem Relation Age of Onset  . Colon cancer Sister   . Diabetes Brother     Current Outpatient Prescriptions  Medication Sig Dispense Refill  . diazepam (VALIUM) 5 MG tablet Take 1 tablet (5 mg total) by mouth every 6 (six) hours as needed (severe dizziness). 20 tablet 0  . meclizine (ANTIVERT) 25 MG tablet Take 1 tablet (25 mg total) by mouth 3 (three) times daily as needed for dizziness. 30 tablet 0  . promethazine (PHENERGAN) 25 MG tablet Take 1 tablet (25 mg total) by mouth every 6 (six) hours as needed for nausea or vomiting. 30 tablet 0  . thyroid (NATURE-THROID) 65 MG tablet Take 1 tablet (65 mg total) by mouth daily. NEED FOLLOW UP APPOINTMENT FOR MORE REFILLS 30 tablet 0  . Zoster Vaccine Live, PF, (ZOSTAVAX) 09811 UNT/0.65ML injection Inject 19,400 Units into the skin once. 1 each 0   No current facility-administered medications for this visit.   No Known Allergies    Review of  Systems: CONSTITUTIONAL:  No  fever, no chills, HEAD/EYES/EARS/NOSE/THROAT: No  headache, no vision change, no hearing change, CARDIAC: No  chest pain, No  pressure RESPIRATORY: No  cough, No  shortness of breath/wheeze GASTROINTESTINAL: No  nausea, No  Vomiting NEUROLOGIC: No  weakness, (+) mild persistent dizziness, No  slurred speech or focal weakness, no confusion or gait disturbance  Exam:  BP 117/73 mmHg  Pulse 69  Ht 6\' 1"  (1.854 m)  Wt 198 lb (89.812 kg)  BMI 26.13 kg/m2 Constitutional: VS see above. General Appearance: alert, well-developed, well-nourished, NAD Eyes: Normal lids and conjunctive, non-icteric sclera, PERRLA, EOMI Ears, Nose, Mouth, Throat: MMM,  Neck: No masses, trachea midline.  Respiratory: Normal respiratory effort. Musculoskeletal: Gait normal.  Neurological: No cranial nerve deficit on limited exam. Motor and sensation intact and symmetric   Psychiatric: Normal judgment/insight. Normal mood and affect. Oriented x3.    No results found for this or any previous visit (from the past 72 hour(s)).  No results found.      [id number] Attending MD: Emeterio Reeve [phone] Ordering MD: Emeterio Reeve Date of Birth: [birth year] Sex: M Admit Date: 03/17/2016 18:36  ###FINAL RESULT###    INDICATIONS: DIZZINESS, HEADACHE, NAUSEA X 3 DAYS COMMENTS:    PROCEDURE: QMR 1334- MRA HEAD WO CONT - Mar 17 2016  [facility] Accession #: [id number] [facility] Accession #: [id number]     MRA head:  INDICATION: Dizziness,  headache, nausea.  COMPARISON: MRI brain from 10/11/2014.  TECHNIQUE: 3-D time-of-flight MR angiography sequences were performed.  Maximum intensity projection images were generated. Axial  diffusion-weighted imaging also performed.  FINDINGS:  No evidence of acute infarct on axial diffusion imaging.  # Distal internal carotid arteries: Normal. # Anterior cerebral arteries: Normal. # Middle cerebral arteries: Normal. # Distal vertebral  arteries: Normal. Nondominant right vertebral artery. # Basilar artery: Normal. # Posterior cerebral arteries: Normal.    IMPRESSION:   1. Normal intracranial MR angiography. 2. No evidence of acute infarct. Please note that this examination does  not fully evaluate the brain parenchyma, if there is continued concern  for intracranial pathology consider MRI brain without contrast.    ASSESSMENT/PLAN:  Postural dizziness - improving, MRI if worse  Hypothyroidism, unspecified hypothyroidism type - stable  Thrombocytopenia (HCC) - stable     All questions were answered. Visit summary with medication list and pertinent instructions was printed for patient to review. ER/RTC precautions were reviewed with the patient. Return if symptoms worsen or fail to improve. Keep routing f/u appt w/ PCP next month

## 2016-04-04 ENCOUNTER — Encounter: Payer: Self-pay | Admitting: Family Medicine

## 2016-04-04 ENCOUNTER — Ambulatory Visit (INDEPENDENT_AMBULATORY_CARE_PROVIDER_SITE_OTHER): Payer: Commercial Managed Care - HMO | Admitting: Family Medicine

## 2016-04-04 VITALS — BP 114/77 | HR 68 | Wt 197.0 lb

## 2016-04-04 DIAGNOSIS — M5412 Radiculopathy, cervical region: Secondary | ICD-10-CM

## 2016-04-04 DIAGNOSIS — R42 Dizziness and giddiness: Secondary | ICD-10-CM | POA: Diagnosis not present

## 2016-04-04 MED ORDER — TRAMADOL HCL 50 MG PO TABS: 25.0000 mg | ORAL_TABLET | Freq: Three times a day (TID) | ORAL | Status: DC | PRN
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
## 2016-04-11 DIAGNOSIS — M4722 Other spondylosis with radiculopathy, cervical region: Secondary | ICD-10-CM | POA: Diagnosis not present

## 2016-04-11 DIAGNOSIS — M5412 Radiculopathy, cervical region: Secondary | ICD-10-CM | POA: Diagnosis not present

## 2016-04-11 DIAGNOSIS — M25511 Pain in right shoulder: Secondary | ICD-10-CM | POA: Diagnosis not present

## 2016-04-22 ENCOUNTER — Ambulatory Visit: Payer: Commercial Managed Care - HMO | Admitting: Family Medicine

## 2016-04-24 ENCOUNTER — Encounter: Payer: Self-pay | Admitting: Family Medicine

## 2016-04-24 DIAGNOSIS — M50221 Other cervical disc displacement at C4-C5 level: Secondary | ICD-10-CM | POA: Diagnosis not present

## 2016-04-24 DIAGNOSIS — M4312 Spondylolisthesis, cervical region: Secondary | ICD-10-CM | POA: Diagnosis not present

## 2016-04-24 DIAGNOSIS — M47812 Spondylosis without myelopathy or radiculopathy, cervical region: Secondary | ICD-10-CM | POA: Diagnosis not present

## 2016-04-24 DIAGNOSIS — M9971 Connective tissue and disc stenosis of intervertebral foramina of cervical region: Secondary | ICD-10-CM | POA: Diagnosis not present

## 2016-04-30 ENCOUNTER — Ambulatory Visit: Payer: Commercial Managed Care - HMO | Admitting: Family Medicine

## 2016-05-05 DIAGNOSIS — M4722 Other spondylosis with radiculopathy, cervical region: Secondary | ICD-10-CM | POA: Diagnosis not present

## 2016-05-12 DIAGNOSIS — M4712 Other spondylosis with myelopathy, cervical region: Secondary | ICD-10-CM | POA: Diagnosis not present

## 2016-05-12 DIAGNOSIS — M4722 Other spondylosis with radiculopathy, cervical region: Secondary | ICD-10-CM | POA: Diagnosis not present

## 2016-05-12 DIAGNOSIS — R112 Nausea with vomiting, unspecified: Secondary | ICD-10-CM | POA: Diagnosis not present

## 2016-05-12 DIAGNOSIS — M4322 Fusion of spine, cervical region: Secondary | ICD-10-CM | POA: Diagnosis not present

## 2016-05-12 DIAGNOSIS — M5412 Radiculopathy, cervical region: Secondary | ICD-10-CM | POA: Diagnosis not present

## 2016-05-12 DIAGNOSIS — E039 Hypothyroidism, unspecified: Secondary | ICD-10-CM | POA: Diagnosis not present

## 2016-05-12 DIAGNOSIS — K219 Gastro-esophageal reflux disease without esophagitis: Secondary | ICD-10-CM | POA: Diagnosis not present

## 2016-05-12 DIAGNOSIS — M25511 Pain in right shoulder: Secondary | ICD-10-CM | POA: Diagnosis not present

## 2016-05-12 DIAGNOSIS — N183 Chronic kidney disease, stage 3 (moderate): Secondary | ICD-10-CM | POA: Diagnosis not present

## 2016-05-12 DIAGNOSIS — M791 Myalgia: Secondary | ICD-10-CM | POA: Diagnosis not present

## 2016-05-12 DIAGNOSIS — M542 Cervicalgia: Secondary | ICD-10-CM | POA: Diagnosis not present

## 2016-05-12 DIAGNOSIS — M4802 Spinal stenosis, cervical region: Secondary | ICD-10-CM | POA: Diagnosis not present

## 2016-05-13 DIAGNOSIS — K219 Gastro-esophageal reflux disease without esophagitis: Secondary | ICD-10-CM | POA: Diagnosis not present

## 2016-05-13 DIAGNOSIS — M791 Myalgia: Secondary | ICD-10-CM | POA: Diagnosis not present

## 2016-05-13 DIAGNOSIS — E039 Hypothyroidism, unspecified: Secondary | ICD-10-CM | POA: Diagnosis not present

## 2016-05-13 DIAGNOSIS — M4802 Spinal stenosis, cervical region: Secondary | ICD-10-CM | POA: Diagnosis not present

## 2016-05-13 DIAGNOSIS — N183 Chronic kidney disease, stage 3 (moderate): Secondary | ICD-10-CM | POA: Diagnosis not present

## 2016-05-13 DIAGNOSIS — R112 Nausea with vomiting, unspecified: Secondary | ICD-10-CM | POA: Diagnosis not present

## 2016-05-13 DIAGNOSIS — M25511 Pain in right shoulder: Secondary | ICD-10-CM | POA: Diagnosis not present

## 2016-05-13 DIAGNOSIS — M4722 Other spondylosis with radiculopathy, cervical region: Secondary | ICD-10-CM | POA: Diagnosis not present

## 2016-05-13 DIAGNOSIS — M4712 Other spondylosis with myelopathy, cervical region: Secondary | ICD-10-CM | POA: Diagnosis not present

## 2016-05-14 DIAGNOSIS — M4802 Spinal stenosis, cervical region: Secondary | ICD-10-CM | POA: Diagnosis not present

## 2016-05-14 DIAGNOSIS — K219 Gastro-esophageal reflux disease without esophagitis: Secondary | ICD-10-CM | POA: Diagnosis not present

## 2016-05-14 DIAGNOSIS — N183 Chronic kidney disease, stage 3 (moderate): Secondary | ICD-10-CM | POA: Diagnosis not present

## 2016-05-14 DIAGNOSIS — M4712 Other spondylosis with myelopathy, cervical region: Secondary | ICD-10-CM | POA: Diagnosis not present

## 2016-05-14 DIAGNOSIS — M791 Myalgia: Secondary | ICD-10-CM | POA: Diagnosis not present

## 2016-05-14 DIAGNOSIS — E039 Hypothyroidism, unspecified: Secondary | ICD-10-CM | POA: Diagnosis not present

## 2016-05-14 DIAGNOSIS — M25511 Pain in right shoulder: Secondary | ICD-10-CM | POA: Diagnosis not present

## 2016-05-14 DIAGNOSIS — R112 Nausea with vomiting, unspecified: Secondary | ICD-10-CM | POA: Diagnosis not present

## 2016-05-14 DIAGNOSIS — M4722 Other spondylosis with radiculopathy, cervical region: Secondary | ICD-10-CM | POA: Diagnosis not present

## 2016-06-11 DIAGNOSIS — Z981 Arthrodesis status: Secondary | ICD-10-CM | POA: Diagnosis not present

## 2016-06-13 ENCOUNTER — Encounter: Payer: Self-pay | Admitting: Physician Assistant

## 2016-06-13 DIAGNOSIS — Z981 Arthrodesis status: Secondary | ICD-10-CM | POA: Insufficient documentation

## 2016-06-19 ENCOUNTER — Ambulatory Visit (INDEPENDENT_AMBULATORY_CARE_PROVIDER_SITE_OTHER): Payer: Commercial Managed Care - HMO | Admitting: Family Medicine

## 2016-06-19 ENCOUNTER — Encounter: Payer: Self-pay | Admitting: Family Medicine

## 2016-06-19 VITALS — BP 113/83 | HR 85 | Wt 199.0 lb

## 2016-06-19 DIAGNOSIS — N183 Chronic kidney disease, stage 3 unspecified: Secondary | ICD-10-CM

## 2016-06-19 DIAGNOSIS — R5383 Other fatigue: Secondary | ICD-10-CM

## 2016-06-19 DIAGNOSIS — E039 Hypothyroidism, unspecified: Secondary | ICD-10-CM | POA: Diagnosis not present

## 2016-06-19 NOTE — Patient Instructions (Addendum)
Thank you for coming in today. Get labs today.   Return in 1 month if not better.   Fatigue Fatigue is feeling tired all of the time, a lack of energy, or a lack of motivation. Occasional or mild fatigue is often a normal response to activity or life in general. However, long-lasting (chronic) or extreme fatigue may indicate an underlying medical condition. HOME CARE INSTRUCTIONS  Watch your fatigue for any changes. The following actions may help to lessen any discomfort you are feeling:  Talk to your health care provider about how much sleep you need each night. Try to get the required amount every night.  Take medicines only as directed by your health care provider.  Eat a healthy and nutritious diet. Ask your health care provider if you need help changing your diet.  Drink enough fluid to keep your urine clear or pale yellow.  Practice ways of relaxing, such as yoga, meditation, massage therapy, or acupuncture.  Exercise regularly.   Change situations that cause you stress. Try to keep your work and personal routine reasonable.  Do not abuse illegal drugs.  Limit alcohol intake to no more than 1 drink per day for nonpregnant women and 2 drinks per day for men. One drink equals 12 ounces of beer, 5 ounces of wine, or 1 ounces of hard liquor.  Take a multivitamin, if directed by your health care provider. SEEK MEDICAL CARE IF:   Your fatigue does not get better.  You have a fever.   You have unintentional weight loss or gain.  You have headaches.   You have difficulty:   Falling asleep.  Sleeping throughout the night.  You feel angry, guilty, anxious, or sad.   You are unable to have a bowel movement (constipation).   You skin is dry.   Your legs or another part of your body is swollen.  SEEK IMMEDIATE MEDICAL CARE IF:   You feel confused.   Your vision is blurry.  You feel faint or pass out.   You have a severe headache.   You have severe  abdominal, pelvic, or back pain.   You have chest pain, shortness of breath, or an irregular or fast heartbeat.   You are unable to urinate or you urinate less than normal.   You develop abnormal bleeding, such as bleeding from the rectum, vagina, nose, lungs, or nipples.  You vomit blood.   You have thoughts about harming yourself or committing suicide.   You are worried that you might harm someone else.    This information is not intended to replace advice given to you by your health care provider. Make sure you discuss any questions you have with your health care provider.   Document Released: 07/20/2007 Document Revised: 10/13/2014 Document Reviewed: 01/24/2014 Elsevier Interactive Patient Education [employer].

## 2016-06-19 NOTE — Progress Notes (Signed)
Joel ARAGONEZ is a 70 y.o. male who presents to [facility] [place] [place] [facility] today for fatigue. Patient notes that he had a cervical spinal fusion about 5 weeks ago. Since the surgery Mr Amano notes continued fatigue. He notes lack of energy. He does note that he has not been sleeping well. He typically sleeps 4 hours a night. He is able to fall asleep and notes that he wakes up a few hours and the sleepiness trouble falling back asleep. He notes this is been ongoing for years. He is very resistant to taking medications. He denies any fevers chills nausea vomiting or diarrhea and feels pre-well overall. He does have a history of hypothyroidism and currently takes nature thyroid.   Past Medical History:  Diagnosis Date  . Hypothyroid 09/21/2012   Past Surgical History:  Procedure Laterality Date  . APPENDECTOMY    . CATARACT EXTRACTION    . HERNIA REPAIR     Social History  Substance Use Topics  . Smoking status: Never Smoker  . Smokeless tobacco: Never Used  . Alcohol use Yes   family history includes Colon cancer in his sister; Diabetes in his brother.  ROS as above:  Medications: Current Outpatient Prescriptions  Medication Sig Dispense Refill  . thyroid (NATURE-THROID) 65 MG tablet Take 1 tablet (65 mg total) by mouth daily. NEED FOLLOW UP APPOINTMENT FOR MORE REFILLS 30 tablet 0  . traMADol (ULTRAM) 50 MG tablet Take 0.5-1 tablets (25-50 mg total) by mouth every 8 (eight) hours as needed (neck pain). 30 tablet 1   No current facility-administered medications for this visit.    No Known Allergies   Exam:  BP 113/83   Pulse 85   Wt 199 lb (90.3 kg)   BMI 26.25 kg/m  Gen: Well NAD HEENT: EOMI,  MMM No significant goiter Lungs: Normal work of breathing. CTABL Heart: RRR no MRG Abd: NABS, Soft. Nondistended, Nontender Exts: Brisk capillary refill, warm and well  perfused.  Neuro/psych: Alert and oriented normal speech thought process affect gait imbalance  Depression screen [facility] 2/9 06/19/2016 01/16/2014  Decreased Interest 3 0  Down, Depressed, Hopeless 0 0  PHQ - 2 Score 3 0  Altered sleeping 3 -  Tired, decreased energy 3 -  Change in appetite 3 -  Feeling bad or failure about yourself  0 -  Trouble concentrating 0 -  Moving slowly or fidgety/restless 0 -  Suicidal thoughts 0 -  PHQ-9 Score 12 -  Difficult doing work/chores Somewhat difficult -     No results found for this or any previous visit (from the past 24 hour(s)). No results found.    Assessment and Plan: 70 y.o. male with fatigue. Unclear etiology. Likely related to sleep. Will initiate limited laboratory workup to evaluate possibilities. Depression is a moderate possibility as well however he's pretty sure he does not have depression. Plan for workup as noted above and watchful waiting. Return in one month if symptoms are still present.   Orders Placed This Encounter  Procedures  . CBC  . Comprehensive metabolic panel    Order Specific Question:   Has the patient fasted?    Answer:   No  . Hemoglobin A1c  . Hepatitis C antibody  . T4, free  . T3, free  . TSH  . VITAMIN D 25 Hydroxy (Vit-D Deficiency, Fractures)  . Ferritin  . Iron and TIBC    Discussed warning signs or symptoms.  Please see discharge instructions. Patient expresses understanding.

## 2016-06-20 LAB — COMPREHENSIVE METABOLIC PANEL
ALK PHOS: 62 U/L (ref 40–115)
ALT: 16 U/L (ref 9–46)
AST: 16 U/L (ref 10–35)
Albumin: 4.2 g/dL (ref 3.6–5.1)
BILIRUBIN TOTAL: 0.4 mg/dL (ref 0.2–1.2)
BUN: 18 mg/dL (ref 7–25)
CALCIUM: 9.2 mg/dL (ref 8.6–10.3)
CO2: 25 mmol/L (ref 20–31)
Chloride: 106 mmol/L (ref 98–110)
Creat: 1.28 mg/dL — ABNORMAL HIGH (ref 0.70–1.25)
Glucose, Bld: 84 mg/dL (ref 65–99)
POTASSIUM: 4.4 mmol/L (ref 3.5–5.3)
Sodium: 141 mmol/L (ref 135–146)
TOTAL PROTEIN: 6.5 g/dL (ref 6.1–8.1)

## 2016-06-20 LAB — CBC
HEMATOCRIT: 42.4 % (ref 38.5–50.0)
Hemoglobin: 14.5 g/dL (ref 13.2–17.1)
MCH: 30.5 pg (ref 27.0–33.0)
MCHC: 34.2 g/dL (ref 32.0–36.0)
MCV: 89.1 fL (ref 80.0–100.0)
MPV: 10.7 fL (ref 7.5–12.5)
RBC: 4.76 MIL/uL (ref 4.20–5.80)
RDW: 13.3 % (ref 11.0–15.0)
WBC: 6.8 10*3/uL (ref 3.8–10.8)

## 2016-06-20 LAB — HEMOGLOBIN A1C
HEMOGLOBIN A1C: 5.4 % (ref <5.7)
MEAN PLASMA GLUCOSE: 108 mg/dL

## 2016-06-20 LAB — IRON AND TIBC
%SAT: 21 % (ref 15–60)
Iron: 65 ug/dL (ref 50–180)
TIBC: 314 ug/dL (ref 250–425)
UIBC: 249 ug/dL (ref 125–400)

## 2016-06-20 LAB — HEPATITIS C ANTIBODY: HCV Ab: NEGATIVE

## 2016-06-20 LAB — VITAMIN D 25 HYDROXY (VIT D DEFICIENCY, FRACTURES): Vit D, 25-Hydroxy: 34 ng/mL (ref 30–100)

## 2016-06-20 LAB — T4, FREE: FREE T4: 1 ng/dL (ref 0.8–1.8)

## 2016-06-20 LAB — TSH: TSH: 3.35 mIU/L (ref 0.40–4.50)

## 2016-06-20 LAB — T3, FREE: T3 FREE: 3.6 pg/mL (ref 2.3–4.2)

## 2016-06-20 LAB — FERRITIN: Ferritin: 127 ng/mL (ref 20–380)

## 2016-06-22 ENCOUNTER — Other Ambulatory Visit: Payer: Self-pay | Admitting: Family Medicine

## 2016-07-05 ENCOUNTER — Emergency Department (INDEPENDENT_AMBULATORY_CARE_PROVIDER_SITE_OTHER)
Admission: EM | Admit: 2016-07-05 | Discharge: 2016-07-05 | Disposition: A | Discharge status: Home / Self Care | Payer: Commercial Managed Care - HMO | Source: Home / Self Care | Attending: Family Medicine | Admitting: Family Medicine

## 2016-07-05 ENCOUNTER — Encounter: Payer: Self-pay | Admitting: Emergency Medicine

## 2016-07-05 DIAGNOSIS — B9789 Other viral agents as the cause of diseases classified elsewhere: Secondary | ICD-10-CM | POA: Diagnosis not present

## 2016-07-05 DIAGNOSIS — J069 Acute upper respiratory infection, unspecified: Secondary | ICD-10-CM | POA: Diagnosis not present

## 2016-07-05 MED ORDER — AZITHROMYCIN 250 MG PO TABS: ORAL_TABLET | ORAL | 0 refills | Status: DC

## 2016-07-05 MED ORDER — GUAIFENESIN-CODEINE 100-10 MG/5ML PO SOLN: ORAL | 0 refills | Status: DC

## 2016-07-05 MED ORDER — PREDNISONE 20 MG PO TABS: ORAL_TABLET | ORAL | 0 refills | Status: DC

## 2016-07-05 NOTE — Discharge Instructions (Signed)
Take plain guaifenesin (1200mg extended release tabs such as Mucinex) twice daily, with plenty of water, for cough and congestion.  May add Pseudoephedrine (30mg, one or two every 4 to 6 hours) for sinus congestion.  Get adequate rest.   °May use Afrin nasal spray (or generic oxymetazoline) twice daily for about 5 days and then discontinue.  Also recommend using saline nasal spray several times daily and saline nasal irrigation (AYR is a common brand).  Use Flonase nasal spray each morning after using Afrin nasal spray and saline nasal irrigation. °Try warm salt water gargles for sore throat.  °Stop all antihistamines for now, and other non-prescription cough/cold preparations. °Begin Azithromycin if not improving about one week or if persistent fever develops   °Follow-up with family doctor if not improving about10 days.  °

## 2016-07-05 NOTE — ED Triage Notes (Signed)
Pt c/o chest congestion, cough, hoarsness x 2-3 days
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
## 2016-07-17 ENCOUNTER — Encounter: Payer: Self-pay | Admitting: Family Medicine

## 2016-07-17 ENCOUNTER — Ambulatory Visit (INDEPENDENT_AMBULATORY_CARE_PROVIDER_SITE_OTHER): Payer: Commercial Managed Care - HMO | Admitting: Family Medicine

## 2016-07-17 VITALS — BP 111/68 | HR 86 | Temp 98.4°F | Wt 201.7 lb

## 2016-07-17 DIAGNOSIS — R5383 Other fatigue: Secondary | ICD-10-CM | POA: Diagnosis not present

## 2016-07-17 DIAGNOSIS — J302 Other seasonal allergic rhinitis: Secondary | ICD-10-CM

## 2016-07-17 NOTE — Progress Notes (Signed)
       Joel Hopkins is a 70 y.o. male who presents to [place]: [place] today for fatigue and discuss runny nose.  Fatigue: Patient last month was seen and complained of fatigue. A laboratory workup was unrevealing. In the interim he has had improvement and feels a bit better. He denies any fevers or chills vomiting or diarrhea.  However over the last few weeks he does note runny nose congestion and itchy watery eyes and sneezing. He was seen in urgent care and treated with prednisone which helped temporarily. He notes symptoms like this typically happen in the fall. He additionally is using some over-the-counter nasal decongestion but has not used any allergy medicine recently.   Past Medical History:  Diagnosis Date  . Hypothyroid 09/21/2012   Past Surgical History:  Procedure Laterality Date  . APPENDECTOMY    . BACK SURGERY    . CATARACT EXTRACTION    . HERNIA REPAIR    . VASECTOMY     Social History  Substance Use Topics  . Smoking status: Never Smoker  . Smokeless tobacco: Never Used  . Alcohol use Yes   family history includes Colon cancer in his sister; Diabetes in his brother.  ROS as above:  Medications: Current Outpatient Prescriptions  Medication Sig Dispense Refill  . guaiFENesin-codeine 100-10 MG/5ML syrup Take 39mL by mouth at bedtime as needed for cough 120 mL 0  . thyroid (NATURE-THROID) 65 MG tablet Take 1 tablet (65 mg total) by mouth daily. 30 tablet 0  . azithromycin (ZITHROMAX Z-PAK) 250 MG tablet Take 2 tabs today; then begin one tab once daily for 4 more days. (Rx void after 07/13/16) (Patient not taking: Reported on 07/17/2016) 6 tablet 0   No current facility-administered medications for this visit.    No Known Allergies  Health Maintenance Health Maintenance  Topic Date Due  . TETANUS/TDAP  06/06/2018 (Originally 09/13/1965)  .  INFLUENZA VACCINE  06/06/2029 (Originally 05/06/2016)  . PNA vac Low Risk Adult (2 of 2 - PPSV23) 10/14/2016  . COLONOSCOPY  08/12/2023  . ZOSTAVAX  Completed  . Hepatitis C Screening  Completed     Exam:  BP 111/68   Pulse 86   Temp 98.4 F (36.9 C) (Oral)   Wt 201 lb 11.2 oz (91.5 kg)   SpO2 97%   BMI 26.61 kg/m  Gen: Well NAD HEENT: EOMI,  MMM Clear nasal discharge. Posterior pharynx cobblestoning. No cervical lymphadenopathy is present. No goiter is present. Lungs: Normal work of breathing. CTABL Heart: RRR no MRG Abd: NABS, Soft. Nondistended, Nontender Exts: Brisk capillary refill, warm and well perfused.    No results found for this or any previous visit (from the past 72 hour(s)). No results found.    Assessment and Plan: 70 y.o. male with  Seasonal allergies. Most likely explanation for nasal symptoms. Recommend antihistamine and steroid nasal spray. For watchful waiting otherwise.  Fatigue: Improving. Laboratory workup was largely normal. Plan for continued watchful waiting and improvement.   No orders of the defined types were placed in this encounter.   Discussed warning signs or symptoms. Please see discharge instructions. Patient expresses understanding.

## 2016-07-17 NOTE — Patient Instructions (Signed)
Thank you for coming in today. Use flonase and zyrtec Return sooner if not better.

## 2016-07-27 ENCOUNTER — Other Ambulatory Visit: Payer: Self-pay | Admitting: Family Medicine

## 2016-07-30 ENCOUNTER — Other Ambulatory Visit: Payer: Self-pay | Admitting: Family Medicine

## 2016-08-01 MED ORDER — THYROID 65 MG PO TABS: 65.0000 mg | ORAL_TABLET | Freq: Every day | ORAL | 0 refills | Status: DC

## 2016-08-02 ENCOUNTER — Encounter: Payer: Self-pay | Admitting: Family Medicine

## 2016-08-04 MED ORDER — THYROID 60 MG PO TABS: 60.0000 mg | ORAL_TABLET | Freq: Every day | ORAL | 0 refills | Status: DC

## 2016-08-08 ENCOUNTER — Telehealth: Payer: Self-pay | Admitting: *Deleted

## 2016-08-08 NOTE — Telephone Encounter (Signed)
error 

## 2016-09-01 ENCOUNTER — Encounter: Payer: Self-pay | Admitting: Family Medicine

## 2016-09-01 DIAGNOSIS — L309 Dermatitis, unspecified: Secondary | ICD-10-CM

## 2016-09-25 DIAGNOSIS — L570 Actinic keratosis: Secondary | ICD-10-CM | POA: Diagnosis not present

## 2016-09-25 DIAGNOSIS — D485 Neoplasm of uncertain behavior of skin: Secondary | ICD-10-CM | POA: Diagnosis not present

## 2016-10-16 DIAGNOSIS — L820 Inflamed seborrheic keratosis: Secondary | ICD-10-CM | POA: Diagnosis not present

## 2016-10-16 DIAGNOSIS — D485 Neoplasm of uncertain behavior of skin: Secondary | ICD-10-CM | POA: Diagnosis not present

## 2016-11-14 ENCOUNTER — Other Ambulatory Visit: Payer: Self-pay | Admitting: Family Medicine

## 2016-12-05 ENCOUNTER — Encounter: Payer: Self-pay | Admitting: Family Medicine

## 2016-12-22 ENCOUNTER — Ambulatory Visit (INDEPENDENT_AMBULATORY_CARE_PROVIDER_SITE_OTHER): Payer: Medicare HMO | Admitting: Family Medicine

## 2016-12-22 VITALS — BP 142/80 | HR 66 | Wt 204.0 lb

## 2016-12-22 DIAGNOSIS — E039 Hypothyroidism, unspecified: Secondary | ICD-10-CM

## 2016-12-22 DIAGNOSIS — M720 Palmar fascial fibromatosis [Dupuytren]: Secondary | ICD-10-CM | POA: Diagnosis not present

## 2016-12-22 MED ORDER — LEVOTHYROXINE SODIUM 88 MCG PO CAPS: 1.0000 | ORAL_CAPSULE | Freq: Every day | ORAL | 3 refills | Status: DC

## 2016-12-22 NOTE — Patient Instructions (Addendum)
Thank you for coming in today. Start levothyroxine daily Recheck in 6 weeks.    Hypothyroidism Hypothyroidism is a disorder of the thyroid. The thyroid is a large gland that is located in the lower front of the neck. The thyroid releases hormones that control how the body works. With hypothyroidism, the thyroid does not make enough of these hormones. What are the causes? Causes of hypothyroidism may include:  Viral infections.  Pregnancy.  Your own defense system (immune system) attacking your thyroid.  Certain medicines.  Birth defects.  Past radiation treatments to your head or neck.  Past treatment with radioactive iodine.  Past surgical removal of part or all of your thyroid.  Problems with the gland that is located in the center of your brain (pituitary). What are the signs or symptoms? Signs and symptoms of hypothyroidism may include:  Feeling as though you have no energy (lethargy).  Inability to tolerate cold.  Weight gain that is not explained by a change in diet or exercise habits.  Dry skin.  Coarse hair.  Menstrual irregularity.  Slowing of thought processes.  Constipation.  Sadness or depression. How is this diagnosed? Your health care provider may diagnose hypothyroidism with blood tests and ultrasound tests. How is this treated? Hypothyroidism is treated with medicine that replaces the hormones that your body does not make. After you begin treatment, it may take several weeks for symptoms to go away. Follow these instructions at home:  Take medicines only as directed by your health care provider.  If you start taking any new medicines, tell your health care provider.  Keep all follow-up visits as directed by your health care provider. This is important. As your condition improves, your dosage needs may change. You will need to have blood tests regularly so that your health care provider can watch your condition. Contact a health care provider  if:  Your symptoms do not get better with treatment.  You are taking thyroid replacement medicine and:  You sweat excessively.  You have tremors.  You feel anxious.  You lose weight rapidly.  You cannot tolerate heat.  You have emotional swings.  You have diarrhea.  You feel weak. Get help right away if:  You develop chest pain.  You develop an irregular heartbeat.  You develop a rapid heartbeat. This information is not intended to replace advice given to you by your health care provider. Make sure you discuss any questions you have with your health care provider. Document Released: 09/22/2005 Document Revised: 02/28/2016 Document Reviewed: 02/07/2014 Elsevier Interactive Patient Education  2017 [employer]    Dupuytren Contracture Dupuytren contracture is a condition in which tissue under the skin of the palm becomes abnormally thickened. This causes one or more of the fingers to curl inward (contract) toward the palm. Eventually, the fingers may not be able to straighten out. This condition affects some or all of the fingers and the palm of the hand. It is often passed along from parent to child (inherited). Dupuytren contracture is a long-term (chronic) condition that develops (progresses) slowly over time. There is no cure, but symptoms can be managed and progression can be slowed with treatment. This condition is usually not dangerous or painful, but it can interfere with everyday tasks. What are the causes? This condition is caused by tissue (fascia) in the palm getting thicker and tighter. When the fascia thickens, it pulls on the cords of tissue (tendons) that control finger movement. This causes the fingers to contract. The cause  of fascia thickening is not known. What increases the risk? This condition may be more likely to develop in:  People who are age 57 or older.  Men.  People with a family history of this condition.  People who use tobacco products,  including cigarettes, chewing tobacco, and e-cigarettes.  People who drink alcohol excessively.  People with diabetes.  People with autoimmune diseases, such as HIV.  People with seizure disorders. What are the signs or symptoms? Symptoms may develop in one or both hands. Any of the fingers can contract. The fingers farthest from the thumb are commonly affected. Usually, this condition is painless. You may have discomfort when holding or grabbing objects. Early symptoms of this condition may include:  Thick, puckered skin on the hand.  One or more lumps (nodules) on the palm. Nodules may be tender when they first appear, but they are generally painless. Symptoms of this condition develop slowly over months or years. Later symptoms of this condition may include:  Thick cords of tissue in the palm.  Fingers curled up toward the palm.  Inability to straighten the fingers into their normal position. How is this diagnosed? This condition is diagnosed with a physical exam, which may include:  Looking at your hands and feeling your hands. This is to check for thickened fascia and nodules.  Measuring finger motion.  Doing the [employer]. You may be asked to try to put your hand on a surface, with your palm down and your fingers straight out. How is this treated? There is no cure for this condition, but treatment can make symptoms more manageable and relieve discomfort. Treatment options may include:  Physical therapy. This can strengthen your hand and increase flexibility.  Occupational therapy. This can help you with everyday tasks that may be more difficult because of your condition.  A hand splint.  Shots (injections). Substances may be injected into your hand, such as:  Medicines that help to decrease swelling (corticosteroids).  Proteins (collagenase) to weaken thick tissue. After a collagenase injection, your health care provider may stretch your  fingers.  Needle aponeurotomy. In this procedure, a needle is pushed through the skin and into the fascia. Moving the needle against the fascia can weaken or break up the thick tissue.  Surgery. This may be needed if your condition causes discomfort or interferes with everyday activities. Physical therapy is usually needed after surgery. In some cases, symptoms never develop to the point of needing major treatment, and caring for yourself at home can be enough to manage your condition. Symptoms often return after treatment. Follow these instructions at home: If you have a splint:   Do not put pressure on any part of the splint until it is fully hardened. This may take several hours.  Wear the splint as told by your health care provider. Remove it only as told by your health care provider.  Loosen the splint if your fingers tingle, become numb, or turn cold and blue.  Do not let your splint get wet if it is not waterproof.  If your splint is not waterproof, cover it with a watertight covering when you take a bath or a shower.  Do not take baths, swim, or use a hot tub until your health care provider approves. Ask your health care provider if you can take showers. You may only be allowed to take sponge baths for bathing.  Keep the splint clean.  Ask your health care provider when it is safe to  drive. Hand Care   Take these actions to help protect your hand from possible injury:  Use tools that have padded grips.  Wear protective gloves while you work with your hands.  Avoid repetitive hand movements.  Avoid actions that cause pain or discomfort.  Stretch your hand by gently pulling your fingers backward toward your wrist. Do this as often as is comfortable. Stop if this causes pain.  Gently massage your hand as often as is comfortable.  If directed, apply heat to the affected area as often as told by your health care provider. Use the heat source that your health care provider  recommends, such as a moist heat pack or a heating pad.  Place a towel between your skin and the heat source.  Leave the heat on for 20-30 minutes.  Remove the heat if your skin turns bright red. This is especially important if you are unable to feel pain, heat, or cold. You may have a greater risk of getting burned. General instructions   Take over-the-counter and prescription medicines only as told by your health care provider.  Manage any other conditions that you have, such as diabetes.  If physical therapy was prescribed, do exercises as told by your health care provider.  Keep all follow-up visits as told by your health care provider. This is important. Contact a health care provider if:  You develop new symptoms, or your symptoms get worse.  You have pain that gets worse or does not get better with medicine.  You have difficulty or discomfort with everyday tasks.  You have problems with your splint.  You develop numbness or tingling. Get help right away if:  You have severe pain.  Your fingers change color or become unusually cold. This information is not intended to replace advice given to you by your health care provider. Make sure you discuss any questions you have with your health care provider. Document Released: 07/20/2009 Document Revised: 11/06/2015 Document Reviewed: 02/14/2015 Elsevier Interactive Patient Education  2017 [employer].

## 2016-12-22 NOTE — Progress Notes (Signed)
Joel Hopkins is a 71 y.o. male who presents to [facility]: [place] today for discuss thyroid medication and contractures on hand.  Patient has been well managed with nature thyroid previously. This is become unavailable and he was switched Armour Thyroid. He was warned by his pharmacist that is probably not very safe. He's been not on his thyroid medicine for about 6 weeks and is looking for something different. It does note worsening fatigue and weight gain off of medications. He feels well otherwise.   Additionally patient notes nodules at the palms of his hands along the third metacarpal. This associated with skin tightness. He is able to extend and flex his hand fully and is not painful. This is been ongoing for months to years. He's not sure if his dad had similar contractures. He denies any injury.  Past Medical History:  Diagnosis Date  . Hypothyroid 09/21/2012   Past Surgical History:  Procedure Laterality Date  . APPENDECTOMY    . BACK SURGERY    . CATARACT EXTRACTION    . HERNIA REPAIR    . VASECTOMY     Social History  Substance Use Topics  . Smoking status: Never Smoker  . Smokeless tobacco: Never Used  . Alcohol use Yes   family history includes Colon cancer in his sister; Diabetes in his brother.  ROS as above:  Medications: Current Outpatient Prescriptions  Medication Sig Dispense Refill  . Levothyroxine Sodium 88 MCG CAPS Take 1 capsule (88 mcg total) by mouth daily before breakfast. 30 capsule 3   No current facility-administered medications for this visit.    No Known Allergies  Health Maintenance Health Maintenance  Topic Date Due  . PNA vac Low Risk Adult (2 of 2 - PPSV23) 10/14/2016  . TETANUS/TDAP  06/06/2018 (Originally 09/13/1965)  . INFLUENZA VACCINE  06/06/2029 (Originally 05/06/2016)  . COLONOSCOPY  08/12/2023  . Hepatitis C  Screening  Completed     Exam:  BP (!) 142/80   Pulse 66   Wt 204 lb (92.5 kg)   BMI 26.91 kg/m  Gen: Well NAD HEENT: EOMI,  MMM Lungs: Normal work of breathing. CTABL Heart: RRR no MRG Abd: NABS, Soft. Nondistended, Nontender Exts: Brisk capillary refill, warm and well perfused.  He has bilaterally have nodular soft tissue contractures and firmness along the third and fourth metacarpals consistent with Dupuytrenontracture appearance   No results found for this or any previous visit (from the past 72 hour(s)). No results found.    Assessment and Plan: 71 y.o. male with  Hypothyroidism: Switch from Armour Thyroid 60 mg to levothyroxine 88 g. This seems to be about the right conversion (88-112). Will start at the low end and titrate up every 6 weeks. Recheck in 6 weeks.  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 It's a little more radial than typical but that's what looks like. He is not affected by it at all currently. Plan first and basic home hand exercise is working on extension and recheck in 6 weeks.   No orders of the defined types were placed in this encounter.  Meds ordered this encounter  Medications  . Levothyroxine Sodium 88 MCG CAPS    Sig: Take 1 capsule (88 mcg total) by mouth daily before breakfast.    Dispense:  30 capsule    Refill:  3     Discussed warning signs or symptoms. Please see discharge instructions. Patient expresses understanding.

## 2016-12-25 DIAGNOSIS — M25511 Pain in right shoulder: Secondary | ICD-10-CM | POA: Diagnosis not present

## 2016-12-25 DIAGNOSIS — M25549 Pain in joints of unspecified hand: Secondary | ICD-10-CM | POA: Diagnosis not present

## 2016-12-25 DIAGNOSIS — M720 Palmar fascial fibromatosis [Dupuytren]: Secondary | ICD-10-CM | POA: Diagnosis not present

## 2016-12-25 DIAGNOSIS — Z981 Arthrodesis status: Secondary | ICD-10-CM | POA: Diagnosis not present

## 2017-01-19 DIAGNOSIS — M7501 Adhesive capsulitis of right shoulder: Secondary | ICD-10-CM | POA: Diagnosis not present

## 2017-01-19 DIAGNOSIS — M25511 Pain in right shoulder: Secondary | ICD-10-CM | POA: Diagnosis not present

## 2017-01-19 DIAGNOSIS — M19011 Primary osteoarthritis, right shoulder: Secondary | ICD-10-CM | POA: Diagnosis not present

## 2017-01-28 DIAGNOSIS — M79621 Pain in right upper arm: Secondary | ICD-10-CM | POA: Diagnosis not present

## 2017-01-28 DIAGNOSIS — G894 Chronic pain syndrome: Secondary | ICD-10-CM | POA: Diagnosis not present

## 2017-01-28 DIAGNOSIS — M79631 Pain in right forearm: Secondary | ICD-10-CM | POA: Diagnosis not present

## 2017-01-28 DIAGNOSIS — M25511 Pain in right shoulder: Secondary | ICD-10-CM | POA: Diagnosis not present

## 2017-02-03 ENCOUNTER — Ambulatory Visit (INDEPENDENT_AMBULATORY_CARE_PROVIDER_SITE_OTHER): Payer: Medicare HMO | Admitting: Family Medicine

## 2017-02-03 ENCOUNTER — Encounter: Payer: Self-pay | Admitting: Family Medicine

## 2017-02-03 VITALS — BP 141/78 | HR 74 | Wt 203.0 lb

## 2017-02-03 DIAGNOSIS — N183 Chronic kidney disease, stage 3 unspecified: Secondary | ICD-10-CM

## 2017-02-03 DIAGNOSIS — R972 Elevated prostate specific antigen [PSA]: Secondary | ICD-10-CM

## 2017-02-03 DIAGNOSIS — E039 Hypothyroidism, unspecified: Secondary | ICD-10-CM | POA: Diagnosis not present

## 2017-02-03 DIAGNOSIS — E785 Hyperlipidemia, unspecified: Secondary | ICD-10-CM

## 2017-02-03 DIAGNOSIS — D696 Thrombocytopenia, unspecified: Secondary | ICD-10-CM

## 2017-02-03 NOTE — Progress Notes (Signed)
       Joel Hopkins is a 71 y.o. male who presents to [facility]: [place] today for follow-up hypothyroidism. Patient was recently transitioned to levothyroxine firm nature thyroid. He's feeling pretty well on 88 g. He denies feeling too hot or too cold skin or hair changes.   Past Medical History:  Diagnosis Date  . Hypothyroid 09/21/2012   Past Surgical History:  Procedure Laterality Date  . APPENDECTOMY    . BACK SURGERY    . CATARACT EXTRACTION    . HERNIA REPAIR    . VASECTOMY     Social History  Substance Use Topics  . Smoking status: Never Smoker  . Smokeless tobacco: Never Used  . Alcohol use Yes   family history includes Colon cancer in his sister; Diabetes in his brother.  ROS as above:  Medications: Current Outpatient Prescriptions  Medication Sig Dispense Refill  . meloxicam (MOBIC) 7.5 MG tablet Take 7.5 mg by mouth.    . Levothyroxine Sodium 88 MCG CAPS Take 1 capsule (88 mcg total) by mouth daily before breakfast. 30 capsule 3   No current facility-administered medications for this visit.    No Known Allergies  Health Maintenance Health Maintenance  Topic Date Due  . PNA vac Low Risk Adult (2 of 2 - PPSV23) 10/14/2016  . TETANUS/TDAP  06/06/2018 (Originally 09/13/1965)  . INFLUENZA VACCINE  06/06/2029 (Originally 05/06/2017)  . COLONOSCOPY  08/12/2023  . Hepatitis C Screening  Completed     Exam:  BP (!) 141/78   Pulse 74   Wt 203 lb (92.1 kg)   BMI 26.78 kg/m  Gen: Well NAD HEENT: EOMI,  MMM Lungs: Normal work of breathing. CTABL Heart: RRR no MRG Abd: NABS, Soft. Nondistended, Nontender Exts: Brisk capillary refill, warm and well perfused.    No results found for this or any previous visit (from the past 72 hour(s)). No results found.    Assessment and Plan: 71 y.o. male with Hypothyroidism doing reasonably well. Check basic  labs including metabolic panel and thyroid panel. Plan to adjust dose of levothyroxine based on TSH.  Patient has a history of hyperlipidemia. Has been about over a year since his lipids were last checked. We'll check these as well as fasting labs.   Hypertension blood pressure remains a bit elevated. Plan for watchful waiting with home blood pressure logs.   Orders Placed This Encounter  Procedures  . CBC  . COMPLETE METABOLIC PANEL WITH GFR  . Lipid Panel w/reflex Direct LDL  . T4, free  . T3, free  . TSH   Meds ordered this encounter  Medications  . meloxicam (MOBIC) 7.5 MG tablet    Sig: Take 7.5 mg by mouth.     Discussed warning signs or symptoms. Please see discharge instructions. Patient expresses understanding.

## 2017-02-03 NOTE — Patient Instructions (Addendum)
Thank you for coming in today. Get labs fasting in the near future.  We will refill the thyroid medicine based on the results of the thyroid labs.

## 2017-02-04 DIAGNOSIS — M79621 Pain in right upper arm: Secondary | ICD-10-CM | POA: Diagnosis not present

## 2017-02-04 DIAGNOSIS — G894 Chronic pain syndrome: Secondary | ICD-10-CM | POA: Diagnosis not present

## 2017-02-04 DIAGNOSIS — M79631 Pain in right forearm: Secondary | ICD-10-CM | POA: Diagnosis not present

## 2017-02-04 DIAGNOSIS — M25511 Pain in right shoulder: Secondary | ICD-10-CM | POA: Diagnosis not present

## 2017-02-04 LAB — LIPID PANEL W/REFLEX DIRECT LDL
CHOL/HDL RATIO: 4.1 ratio (ref <5.0)
CHOLESTEROL: 222 mg/dL — AB (ref <200)
HDL: 54 mg/dL (ref >40)
LDL-Cholesterol: 152 mg/dL — ABNORMAL HIGH
Non-HDL Cholesterol (Calc): 168 mg/dL — ABNORMAL HIGH (ref <130)
Triglycerides: 68 mg/dL (ref <150)

## 2017-02-04 LAB — COMPLETE METABOLIC PANEL WITH GFR
ALK PHOS: 56 U/L (ref 40–115)
ALT: 17 U/L (ref 9–46)
AST: 14 U/L (ref 10–35)
Albumin: 4 g/dL (ref 3.6–5.1)
BILIRUBIN TOTAL: 0.6 mg/dL (ref 0.2–1.2)
BUN: 21 mg/dL (ref 7–25)
CALCIUM: 9 mg/dL (ref 8.6–10.3)
CO2: 21 mmol/L (ref 20–31)
CREATININE: 1.38 mg/dL — AB (ref 0.70–1.25)
Chloride: 108 mmol/L (ref 98–110)
GFR, Est African American: 60 mL/min (ref >=60)
GFR, Est Non African American: 52 mL/min — ABNORMAL LOW (ref >=60)
GLUCOSE: 87 mg/dL (ref 65–99)
POTASSIUM: 4.6 mmol/L (ref 3.5–5.3)
SODIUM: 143 mmol/L (ref 135–146)
TOTAL PROTEIN: 6.7 g/dL (ref 6.1–8.1)

## 2017-02-04 LAB — TSH: TSH: 2.42 m[IU]/L (ref 0.40–4.50)

## 2017-02-04 LAB — CBC
HCT: 44.1 % (ref 38.5–50.0)
Hemoglobin: 15.1 g/dL (ref 13.2–17.1)
MCH: 30.6 pg (ref 27.0–33.0)
MCHC: 34.2 g/dL (ref 32.0–36.0)
MCV: 89.5 fL (ref 80.0–100.0)
MPV: 11.3 fL (ref 7.5–12.5)
Platelets: 60 10*3/uL — ABNORMAL LOW (ref 140–400)
RBC: 4.93 MIL/uL (ref 4.20–5.80)
RDW: 14.2 % (ref 11.0–15.0)
WBC: 7 10*3/uL (ref 3.8–10.8)

## 2017-02-04 LAB — T4, FREE: Free T4: 1.2 ng/dL (ref 0.8–1.8)

## 2017-02-04 LAB — T3, FREE: T3 FREE: 3.3 pg/mL (ref 2.3–4.2)

## 2017-02-05 ENCOUNTER — Encounter: Payer: Self-pay | Admitting: Family Medicine

## 2017-02-05 DIAGNOSIS — D696 Thrombocytopenia, unspecified: Secondary | ICD-10-CM | POA: Insufficient documentation

## 2017-02-05 MED ORDER — LEVOTHYROXINE SODIUM 88 MCG PO CAPS: 1.0000 | ORAL_CAPSULE | Freq: Every day | ORAL | 1 refills | Status: DC

## 2017-02-05 MED ORDER — ATORVASTATIN CALCIUM 10 MG PO TABS: 10.0000 mg | ORAL_TABLET | Freq: Every day | ORAL | 0 refills | Status: DC

## 2017-02-05 NOTE — Addendum Note (Signed)
Addended by: Gregor Hams on: 02/05/2017 07:35 AM   Modules accepted: Orders

## 2017-02-11 DIAGNOSIS — M79621 Pain in right upper arm: Secondary | ICD-10-CM | POA: Diagnosis not present

## 2017-02-11 DIAGNOSIS — G894 Chronic pain syndrome: Secondary | ICD-10-CM | POA: Diagnosis not present

## 2017-02-11 DIAGNOSIS — M79631 Pain in right forearm: Secondary | ICD-10-CM | POA: Diagnosis not present

## 2017-02-11 DIAGNOSIS — M25511 Pain in right shoulder: Secondary | ICD-10-CM | POA: Diagnosis not present

## 2017-02-18 DIAGNOSIS — M79631 Pain in right forearm: Secondary | ICD-10-CM | POA: Diagnosis not present

## 2017-02-18 DIAGNOSIS — M25511 Pain in right shoulder: Secondary | ICD-10-CM | POA: Diagnosis not present

## 2017-02-18 DIAGNOSIS — M79621 Pain in right upper arm: Secondary | ICD-10-CM | POA: Diagnosis not present

## 2017-02-18 DIAGNOSIS — G894 Chronic pain syndrome: Secondary | ICD-10-CM | POA: Diagnosis not present

## 2017-02-25 DIAGNOSIS — G894 Chronic pain syndrome: Secondary | ICD-10-CM | POA: Diagnosis not present

## 2017-02-25 DIAGNOSIS — M79621 Pain in right upper arm: Secondary | ICD-10-CM | POA: Diagnosis not present

## 2017-02-25 DIAGNOSIS — M25511 Pain in right shoulder: Secondary | ICD-10-CM | POA: Diagnosis not present

## 2017-02-25 DIAGNOSIS — M79631 Pain in right forearm: Secondary | ICD-10-CM | POA: Diagnosis not present

## 2017-02-27 ENCOUNTER — Ambulatory Visit: Payer: Medicare HMO

## 2017-02-27 ENCOUNTER — Other Ambulatory Visit: Payer: Medicare HMO

## 2017-02-27 ENCOUNTER — Ambulatory Visit: Payer: Medicare HMO | Admitting: Hematology & Oncology

## 2017-03-03 DIAGNOSIS — M7501 Adhesive capsulitis of right shoulder: Secondary | ICD-10-CM | POA: Diagnosis not present

## 2017-03-05 ENCOUNTER — Ambulatory Visit: Payer: Medicare HMO

## 2017-03-05 ENCOUNTER — Ambulatory Visit (HOSPITAL_BASED_OUTPATIENT_CLINIC_OR_DEPARTMENT_OTHER): Payer: Medicare HMO

## 2017-03-05 DIAGNOSIS — D696 Thrombocytopenia, unspecified: Secondary | ICD-10-CM

## 2017-03-05 LAB — CBC WITH DIFFERENTIAL (CANCER CENTER ONLY)
BASO#: 0 10*3/uL (ref 0.0–0.2)
BASO%: 0.6 % (ref 0.0–2.0)
EOS%: 4.3 % (ref 0.0–7.0)
Eosinophils Absolute: 0.3 10*3/uL (ref 0.0–0.5)
HEMATOCRIT: 43.6 % (ref 38.7–49.9)
HGB: 15.3 g/dL (ref 13.0–17.1)
LYMPH#: 1.9 10*3/uL (ref 0.9–3.3)
LYMPH%: 27.6 % (ref 14.0–48.0)
MCH: 30.9 pg (ref 28.0–33.4)
MCHC: 35.1 g/dL (ref 32.0–35.9)
MCV: 88 fL (ref 82–98)
MONO#: 0.4 10*3/uL (ref 0.1–0.9)
MONO%: 6.1 % (ref 0.0–13.0)
NEUT#: 4.1 10*3/uL (ref 1.5–6.5)
NEUT%: 61.4 % (ref 40.0–80.0)
RBC: 4.95 10*6/uL (ref 4.20–5.70)
RDW: 13 % (ref 11.1–15.7)
WBC: 6.7 10*3/uL (ref 4.0–10.0)

## 2017-03-05 LAB — COMPREHENSIVE METABOLIC PANEL (CC13)
ALK PHOS: 70 IU/L (ref 39–117)
ALT: 19 IU/L (ref 0–44)
AST: 17 IU/L (ref 0–40)
Albumin, Serum: 4.3 g/dL (ref 3.6–4.8)
Albumin/Globulin Ratio: 1.6 (ref 1.2–2.2)
BUN/Creatinine Ratio: 14 (ref 10–24)
BUN: 20 mg/dL (ref 8–27)
Bilirubin Total: 0.3 mg/dL (ref 0.0–1.2)
CALCIUM: 9.3 mg/dL (ref 8.6–10.2)
CO2: 24 mmol/L (ref 18–29)
CREATININE: 1.45 mg/dL — AB (ref 0.76–1.27)
Chloride, Ser: 106 mmol/L (ref 96–106)
GFR calc Af Amer: 56 mL/min/{1.73_m2} — ABNORMAL LOW (ref >59)
GFR, EST NON AFRICAN AMERICAN: 49 mL/min/{1.73_m2} — AB (ref >59)
GLOBULIN, TOTAL: 2.7 g/dL (ref 1.5–4.5)
GLUCOSE: 102 mg/dL — AB (ref 65–99)
Potassium, Ser: 4.2 mmol/L (ref 3.5–5.2)
SODIUM: 139 mmol/L (ref 134–144)
Total Protein: 7 g/dL (ref 6.0–8.5)

## 2017-03-06 ENCOUNTER — Ambulatory Visit (HOSPITAL_BASED_OUTPATIENT_CLINIC_OR_DEPARTMENT_OTHER): Payer: Medicare HMO | Admitting: Hematology & Oncology

## 2017-03-06 VITALS — BP 134/80 | HR 78 | Temp 98.1°F | Resp 17 | Ht 73.0 in | Wt 200.2 lb

## 2017-03-06 DIAGNOSIS — N62 Hypertrophy of breast: Secondary | ICD-10-CM | POA: Diagnosis not present

## 2017-03-06 DIAGNOSIS — D696 Thrombocytopenia, unspecified: Secondary | ICD-10-CM

## 2017-03-06 LAB — VITAMIN B12: VITAMIN B 12: 499 pg/mL (ref 232–1245)

## 2017-03-06 LAB — LACTATE DEHYDROGENASE: LDH: 191 U/L (ref 125–245)

## 2017-03-06 LAB — CHCC SATELLITE - SMEAR

## 2017-03-06 NOTE — Progress Notes (Signed)
Referral MD  Reason for Referral: Transient thrombocytopenia   Chief Complaint  Patient presents with  . Follow-up  : I'm not sure why I am here.  HPI: Joel Hopkins is a really nice 71 year old white male. He comes in with his wife. 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  He has had some bruising. There's been no bleeding.  He sees Dr. Georgina Snell. As always, Dr. Georgina Snell is very thorough. He does routine lab work. He is noted that his platelet count has been low in the past several years.  Joel Hopkins does not smoke. He may have an occasional drink.  He has had no weight loss or weight gain. He does try to exercise. He is not a vegetarian.  Going back to 2014, his platelet was 78,000. He had no problems with his white cells or red cells.  In 2017, his platelet count 83,000.  A month ago, his CBC showed a white cell count of 7. Hemoglobin 15.1. Platelet count 60,000. His MCV is 89.  Of note, on several occasions when his CBC was drawn, it was noted that his platelets "clumped."  There is no family history of blood problems.  He's had no rashes. He's had no fever. He's had no leg swelling.  He has had multiple surgeries. Last year he had fairly extensive neck surgery. There was no issues with bleeding.  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 This was several years ago. He does not recall any issues with bleeding.  Of note, in July 2017, at [facility], his platelet count was 141,000.  Overall, his performance status is ECOG 0.    Past Medical History:  Diagnosis Date  . Hypothyroid 09/21/2012  :  Past Surgical History:  Procedure Laterality Date  . APPENDECTOMY    . BACK SURGERY    . CATARACT EXTRACTION    . HERNIA REPAIR    . VASECTOMY    :   Current Outpatient Prescriptions:  .  meloxicam (MOBIC) 7.5 MG tablet, Take 7.5 mg by mouth., Disp: , Rfl:  .  Levothyroxine Sodium 88 MCG CAPS, Take 1  capsule (88 mcg total) by mouth daily before breakfast., Disp: 90 capsule, Rfl: 1:  :  No Known Allergies:  Family History  Problem Relation Age of Onset  . Colon cancer Sister   . Diabetes Brother   :  Social History   Social History  . Marital status: Married    Spouse name: N/A  . Number of children: N/A  . Years of education: N/A   Occupational History  . Not on file.   Social History Main Topics  . Smoking status: Never Smoker  . Smokeless tobacco: Never Used  . Alcohol use Yes  . Drug use: No  . Sexual activity: Yes   Other Topics Concern  . Not on file   Social History Narrative  . No narrative on file  :  Pertinent items are noted in HPI.  Exam:Well-developed and well-nourished white male in no obvious distress. Vital signs show a temperature of 98.1. Pulse 78. Blood pressure 134/80. Weight is 200 pounds. Head and neck exam shows no ocular or oral lesions. He has no scleral icterus. There is no adenopathy in the neck. Lungs are clear bilaterally. Cardiac exam regular rate and rhythm with no murmurs, rubs or bruits. Abdomen is soft. He has good bowel sounds. There is no fluid  wave. There is no palpable liver or spleen tip. Chest wall exam seems to show some gynecomastia bilaterally. Back exam shows no tenderness over the spine, ribs or hips. Extremities shows no clubbing, cyanosis or edema. He has good range of motion of his joints. Skin exam shows numerous hyperpigmented lesions. He does have the wire local excision scar in the right upper back close to the spine. This probably is about T5. He has no ecchymoses. He does have some varicose veins. Neurological exam shows no focal neurological deficits.    Recent Labs  03/05/17 1455  WBC 6.7  HGB 15.3  HCT 43.6  PLT 215 Platelet count consistent in citrate    Recent Labs  03/05/17 1455  NA 139  K 4.2  CL 106  CO2 24  GLUCOSE 102*  BUN 20  CREATININE 1.45*  CALCIUM 9.3    Blood smear review:   Normochromic and normocytic population of red blood cells. He has no nucleated red blood cells. There are no teardrop cells. He has no rouleau formation. There are no inclusion bodies. There are no target cells. White blood cells per normal in morphology maturation. There is no immature myeloid or lymphoid forms. Platelets are adequate in number and size. He has a few large platelets. Platelets are well granulated.  Pathology: None     Assessment and Plan:  Joel Hopkins is a 71 year old white male with transient thrombocytopenia. I have to believe that this actually might be pseudo-thrombocytopenia from clumping. I think it is very interesting that he has had several CBCs in which the platelets "clumped."  We did the platelets in a citrate tube. The platelets did not clump and his platelet count was normal.  I just do not believe that there is a hematologic issue here. I'm thankful for that.  However, it looks like he has gynecomastia. I'm not sure of this truly is the case. I think that a male mammogram would be needed.  I talked to the patient and his wife. They're both very nice. It was a lot of fun talking with them.  He was found me that he probably has driven for many miles in total with his job. I'm absolutely amazed by this.  I think we should see him back in 4 months. If his platelet count is still normal in 4 months, then we can let him go from the clinic.  I spent about 50 minutes with he and his wife. I answered all their questions.

## 2017-03-11 DIAGNOSIS — M79631 Pain in right forearm: Secondary | ICD-10-CM | POA: Diagnosis not present

## 2017-03-11 DIAGNOSIS — M25511 Pain in right shoulder: Secondary | ICD-10-CM | POA: Diagnosis not present

## 2017-03-11 DIAGNOSIS — M79621 Pain in right upper arm: Secondary | ICD-10-CM | POA: Diagnosis not present

## 2017-03-11 DIAGNOSIS — G894 Chronic pain syndrome: Secondary | ICD-10-CM | POA: Diagnosis not present

## 2017-03-12 NOTE — Addendum Note (Signed)
Addended by: Volanda Napoleon on: 03/12/2017 09:37 AM   Modules accepted: Orders

## 2017-03-17 ENCOUNTER — Other Ambulatory Visit: Payer: Self-pay | Admitting: Family

## 2017-03-17 DIAGNOSIS — N62 Hypertrophy of breast: Secondary | ICD-10-CM

## 2017-03-18 DIAGNOSIS — M25511 Pain in right shoulder: Secondary | ICD-10-CM | POA: Diagnosis not present

## 2017-03-18 DIAGNOSIS — G894 Chronic pain syndrome: Secondary | ICD-10-CM | POA: Diagnosis not present

## 2017-03-18 DIAGNOSIS — M79621 Pain in right upper arm: Secondary | ICD-10-CM | POA: Diagnosis not present

## 2017-03-18 DIAGNOSIS — M79631 Pain in right forearm: Secondary | ICD-10-CM | POA: Diagnosis not present

## 2017-03-20 ENCOUNTER — Ambulatory Visit
Admission: RE | Admit: 2017-03-20 | Discharge: 2017-03-20 | Disposition: A | Discharge status: Home / Self Care | Payer: Medicare HMO | Source: Ambulatory Visit | Attending: Family | Admitting: Family

## 2017-03-20 DIAGNOSIS — N62 Hypertrophy of breast: Secondary | ICD-10-CM

## 2017-03-20 DIAGNOSIS — R928 Other abnormal and inconclusive findings on diagnostic imaging of breast: Secondary | ICD-10-CM | POA: Diagnosis not present

## 2017-04-09 ENCOUNTER — Ambulatory Visit (INDEPENDENT_AMBULATORY_CARE_PROVIDER_SITE_OTHER): Payer: Medicare HMO | Admitting: Family Medicine

## 2017-04-09 VITALS — BP 140/85 | HR 71 | Temp 97.8°F | Wt 205.0 lb

## 2017-04-09 DIAGNOSIS — I8390 Asymptomatic varicose veins of unspecified lower extremity: Secondary | ICD-10-CM | POA: Diagnosis not present

## 2017-04-09 DIAGNOSIS — I781 Nevus, non-neoplastic: Secondary | ICD-10-CM

## 2017-04-09 DIAGNOSIS — S51812A Laceration without foreign body of left forearm, initial encounter: Secondary | ICD-10-CM

## 2017-04-09 DIAGNOSIS — Z23 Encounter for immunization: Secondary | ICD-10-CM

## 2017-04-09 NOTE — Progress Notes (Signed)
Joel Hopkins is a 71 y.o. male who presents to [facility] today for left arm wound and varicose vein right leg.  Patient injured his left forearm a few days ago. He treated at home with wound irrigation and antibiotic ointment. He notes he has some puffiness around the site of the wound but otherwise denies any pain or fevers and feels well. He cannot recall his last tetanus vaccine.  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 He has read that this can be treated and he would like treatment if possible. He notes the pain interferes with his quality of life and ability to exercise normally.   Past Medical History:  Diagnosis Date  . Hypothyroid 09/21/2012   Past Surgical History:  Procedure Laterality Date  . APPENDECTOMY    . BACK SURGERY    . CATARACT EXTRACTION    . HERNIA REPAIR    . VASECTOMY     Social History  Substance Use Topics  . Smoking status: Never Smoker  . Smokeless tobacco: Never Used  . Alcohol use Yes   family history includes Colon cancer in his sister; Diabetes in his brother.  ROS as above:  Medications: Current Outpatient Prescriptions  Medication Sig Dispense Refill  . Levothyroxine Sodium 88 MCG CAPS Take 1 capsule (88 mcg total) by mouth daily before breakfast. 90 capsule 1   No current facility-administered medications for this visit.    No Known Allergies  Health Maintenance Health Maintenance  Topic Date Due  . PNA vac Low Risk Adult (2 of 2 - PPSV23) 10/14/2016  . TETANUS/TDAP  06/06/2018 (Originally 09/13/1965)  . INFLUENZA VACCINE  06/06/2029 (Originally 05/06/2017)  . COLONOSCOPY  08/12/2023  . Hepatitis C Screening  Completed     Exam:  BP 140/85 (BP Location: Right Arm, Patient Position: Sitting, Cuff Size:  Normal)   Pulse 71   Temp 97.8 F (36.6 C) (Oral)   Wt 205 lb (93 kg)   SpO2 96%   BMI 27.05 kg/m  Gen: Well NAD HEENT: EOMI,  MMM Lungs: Normal work of breathing. CTABL Heart: RRR no MRG Abd: NABS, Soft. Nondistended, Nontender Exts: Brisk capillary refill, warm and well perfused.  Skin: Left forearm well-appearing laceration with slightly swollen area deep to the laceration. This is not tender with no surrounding erythema or induration. No bleeding. MSK: Left hand and forearm is nontender with normal extension strength. No pain with wrist flexion and finger flexion.  Right medial thigh area of mild varicosity and telangiectasia with some firm nodules. Mildly tender.   No results found for this or any previous visit (from the past 72 hour(s)). No results found.    Assessment and Plan: 71 y.o. male with  Left forearm laceration with skin tear. Doing well. I think the swollen area is a hematoma. We'll plan for watchful waiting. Will administer a Tdap vaccine prior to discharge. No tendon involvement on physical exam.  Additionally patient has varicosities and telangiectasia of the left medial thigh. This is tender and interfering with quality of life. Plan to refer to vascular surgery for definitive treatment. He does not have any other varicosities therefore I don't think her venous reflux study is necessary at this time although I defer to the expertise of vascular surgery.  Pneumonia 23 vaccine given prior to discharge   Orders Placed This Encounter  Procedures  . Tdap vaccine greater than or equal to 7yo IM  . Pneumococcal polysaccharide vaccine 23-valent greater than or equal to 2yo subcutaneous/IM  . Ambulatory referral to Vascular Surgery    Referral Priority:   Routine    Referral Type:   Surgical    Referral Reason:   Specialty Services Required    Requested Specialty:   Vascular Surgery    Number of Visits Requested:   1   No orders of the defined types were  placed in this encounter.    Discussed warning signs or symptoms. Please see discharge instructions. Patient expresses understanding.

## 2017-04-09 NOTE — Patient Instructions (Addendum)
Thank you for coming in today. You should hear from the vascular surgeon soon.  Let me know if you dont hear anything.   We will do vaccines today.  Recheck in 6 months or sooner if needed.

## 2017-04-16 DIAGNOSIS — Z85820 Personal history of malignant melanoma of skin: Secondary | ICD-10-CM | POA: Diagnosis not present

## 2017-04-16 DIAGNOSIS — Z08 Encounter for follow-up examination after completed treatment for malignant neoplasm: Secondary | ICD-10-CM | POA: Diagnosis not present

## 2017-04-17 ENCOUNTER — Other Ambulatory Visit: Payer: Self-pay

## 2017-04-17 DIAGNOSIS — I8390 Asymptomatic varicose veins of unspecified lower extremity: Secondary | ICD-10-CM

## 2017-06-03 ENCOUNTER — Encounter: Payer: Self-pay | Admitting: Surgery

## 2017-06-10 ENCOUNTER — Ambulatory Visit (INDEPENDENT_AMBULATORY_CARE_PROVIDER_SITE_OTHER): Payer: Medicare HMO | Admitting: Family Medicine

## 2017-06-10 ENCOUNTER — Encounter: Payer: Self-pay | Admitting: Family Medicine

## 2017-06-10 ENCOUNTER — Ambulatory Visit (HOSPITAL_COMMUNITY)
Admission: RE | Admit: 2017-06-10 | Discharge: 2017-06-10 | Disposition: A | Discharge status: Home / Self Care | Payer: Medicare HMO | Source: Ambulatory Visit | Attending: Surgery | Admitting: Surgery

## 2017-06-10 ENCOUNTER — Ambulatory Visit (INDEPENDENT_AMBULATORY_CARE_PROVIDER_SITE_OTHER): Payer: Medicare HMO | Admitting: Surgery

## 2017-06-10 ENCOUNTER — Encounter: Payer: Self-pay | Admitting: Surgery

## 2017-06-10 ENCOUNTER — Ambulatory Visit (INDEPENDENT_AMBULATORY_CARE_PROVIDER_SITE_OTHER): Payer: Medicare HMO

## 2017-06-10 VITALS — BP 134/87 | HR 83 | Ht 73.0 in | Wt 205.3 lb

## 2017-06-10 VITALS — BP 148/94 | HR 73 | Temp 97.1°F | Resp 20 | Ht 73.0 in | Wt 203.2 lb

## 2017-06-10 DIAGNOSIS — I8391 Asymptomatic varicose veins of right lower extremity: Secondary | ICD-10-CM | POA: Diagnosis not present

## 2017-06-10 DIAGNOSIS — M25522 Pain in left elbow: Secondary | ICD-10-CM

## 2017-06-10 DIAGNOSIS — S59902A Unspecified injury of left elbow, initial encounter: Secondary | ICD-10-CM | POA: Diagnosis not present

## 2017-06-10 DIAGNOSIS — I872 Venous insufficiency (chronic) (peripheral): Secondary | ICD-10-CM | POA: Diagnosis not present

## 2017-06-10 DIAGNOSIS — I8390 Asymptomatic varicose veins of unspecified lower extremity: Secondary | ICD-10-CM | POA: Diagnosis not present

## 2017-06-10 DIAGNOSIS — R609 Edema, unspecified: Secondary | ICD-10-CM | POA: Diagnosis not present

## 2017-06-10 DIAGNOSIS — H60332 Swimmer's ear, left ear: Secondary | ICD-10-CM

## 2017-06-10 MED ORDER — DICLOFENAC SODIUM 1 % TD GEL: 2.0000 g | Freq: Four times a day (QID) | TRANSDERMAL | 11 refills | Status: DC

## 2017-06-10 MED ORDER — NEOMYCIN-POLYMYXIN-HC 3.5-10000-1 OT SOLN: 3.0000 [drp] | Freq: Four times a day (QID) | OTIC | 0 refills | Status: DC

## 2017-06-10 NOTE — Progress Notes (Signed)
Joel Hopkins is a 71 y.o. male who presents to [facility] today for left elbow pain, left ear pain.  Left elbow pain: Patient bumped his left elbow near the medial epicondyle about a month ago. He notes continued pain in this area especially with full elbow extension and pronation. He denies any radiating pain or swelling fevers or chills. He feels well otherwise. He has not tried any treatment yet.  Additionally patient notes left ear pain. This is tender with some drainage. He's had this addressed previously was thought to have otitis externa. He's been using rubbing alcohol eardrops which have helped a little. No fevers or chills nausea vomiting or diarrhea.   Past Medical History:  Diagnosis Date  . Hypothyroid 09/21/2012   Past Surgical History:  Procedure Laterality Date  . APPENDECTOMY    . BACK SURGERY    . CATARACT EXTRACTION    . HERNIA REPAIR    . VASECTOMY     Social History  Substance Use Topics  . Smoking status: Never Smoker  . Smokeless tobacco: Never Used  . Alcohol use Yes   family history includes Colon cancer in his sister; Diabetes in his brother.  ROS as above:  Medications: Current Outpatient Prescriptions  Medication Sig Dispense Refill  . Levothyroxine Sodium 88 MCG CAPS Take 1 capsule (88 mcg total) by mouth daily before breakfast. 90 capsule 1  . diclofenac sodium (VOLTAREN) 1 % GEL Apply 2 g topically 4 (four) times daily. To affected joint. 100 g 11  . neomycin-polymyxin-hydrocortisone (CORTISPORIN) OTIC solution Place 3 drops into the left ear 4 (four) times daily. 10 mL 0   No current facility-administered medications for this visit.    No Known Allergies  Health Maintenance Health Maintenance  Topic Date Due  . INFLUENZA VACCINE  06/06/2029 (Originally 05/06/2017)  . COLONOSCOPY  08/12/2023  . TETANUS/TDAP   04/10/2027  . Hepatitis C Screening  Completed  . PNA vac Low Risk Adult  Completed     Exam:  BP 134/87   Pulse 83   Ht 6\' 1"  (1.854 m)   Wt 205 lb 4.8 oz (93.1 kg)   SpO2 99%   BMI 27.09 kg/m  Gen: Well NAD HEENT: EOMI,  MMM Left ear canal is mildly erythematous otherwise the left ear is normal-appearing Lungs: Normal work of breathing. CTABL Heart: RRR no MRG Abd: NABS, Soft. Nondistended, Nontender Exts: Brisk capillary refill, warm and well perfused.  Left elbow normal-appearing no erythema or swelling. Minimally tender at the medial epicondyle. Pain is reproducible with elbow extension and resisted pronation.  Minimal Tinel's at the cubital tunnel. Stable ligamentous exam. Intact flexion and extension strength.   No results found for this or any previous visit (from the past 72 hour(s)). Dg Elbow Complete Left  Result Date: 06/10/2017 CLINICAL DATA:  Posteromedial elbow pain for 1 month after hitting elbow on an unknown object EXAM: LEFT ELBOW - COMPLETE 3+ VIEW COMPARISON:  None FINDINGS: Osseous mineralization normal. Joint spaces preserved. No acute fracture, dislocation, or bone destruction. No elbow joint effusion. IMPRESSION: Normal exam. Electronically Signed   By: Lavonia Dana M.D.   On: 06/10/2017 16:24      Assessment and Plan: 71 y.o. male with  Elbow pain is likely injury to the pronator structures likely pronator teres of the left elbow. X-ray is unremarkable. Plan for home physical therapy exercises as well as diclofenac gel. If not better  in a month we'll likely refer to hand physical therapy or obtain an MRI.  Left ear pain is likely otitis externa. Plan to treat with Cortisporin eardrops if not better may consider referral to ENT.   Orders Placed This Encounter  Procedures  . DG Elbow Complete Left    Standing Status:   Future    Number of Occurrences:   1    Standing Expiration Date:   08/10/2018    Order Specific Question:   Reason for Exam  (SYMPTOM  OR DIAGNOSIS REQUIRED)    Answer:   eval medial elbow pain    Order Specific Question:   Preferred imaging location?    Answer:   [facility]    Order Specific Question:   Radiology Contrast Protocol - do NOT remove file path    Answer:   [email or web address]   Meds ordered this encounter  Medications  . DISCONTD: diclofenac sodium (VOLTAREN) 1 % GEL    Sig: Apply 2 g topically 4 (four) times daily. To affected joint.    Dispense:  100 g    Refill:  11  . neomycin-polymyxin-hydrocortisone (CORTISPORIN) OTIC solution    Sig: Place 3 drops into the left ear 4 (four) times daily.    Dispense:  10 mL    Refill:  0  . diclofenac sodium (VOLTAREN) 1 % GEL    Sig: Apply 2 g topically 4 (four) times daily. To affected joint.    Dispense:  100 g    Refill:  11     Discussed warning signs or symptoms. Please see discharge instructions. Patient expresses understanding.

## 2017-06-10 NOTE — Patient Instructions (Addendum)
Thank you for coming in today. Do the forearm exercises back and forth (supinator and pronator) with a weight.  Recheck in 1 month if not better.  Use voltaren gel 4x dialy.   Use the ear drops 4x daily.   Let mw know if not getting better.      Pronator Syndrome The median nerve is a nerve in your forearm that provides feeling to certain parts of your hand. Pronator syndrome is a condition that happens when the median nerve is squeezed (compressed) by a muscle or other structure. The condition can cause weakness or tingling in your thumb, index, middle, and ring fingers, or it can cause a dull ache or pain in your forearm. What are the causes? This condition may be caused by:  Overuse or repetitive motions that increase the size of a muscle in your forearm (pronator teres).  Trauma or a hard, direct hit (blow) to the forearm, resulting in swelling (hematoma).  A birth defect.  What increases the risk? This condition is more likely to develop in:  People who play sports such as baseball, tennis, or golf.  People who have a job that requires grasping objects and twisting the forearm, such as carpentry.  Adults who are 65-19 years old.  Females.  What are the signs or symptoms? Symptoms of this condition include:  An ache or pain in the underside of your forearm close to the elbow.  A tingling or prickling feeling (sensation) in your index, middle, and ring finger as well as your thumb.  Weakness in your hand, particularly the pinch grip between your index finger and thumb.  Symptoms get worse with repetitive movement or rotation of the forearm. How is this diagnosed? This condition can be diagnosed based on your symptoms, your medical history, and a physical exam. During your exam, you may be asked to move your hand, fingers, wrist, and arm in certain ways. Doing this will help your health care provider find the source of your pain. You may also have tests, such as:  An  electromyogram. This test can show how well the median nerve is working and show if there is too much pressure on it or a nearby nerve.  A nerve conduction study. This test measures how well electrical signals pass through your nerves.  An MRI. This test can show nerve problems.  An X-ray. This test may be done to check for an underlying problem, such as a break or crack in a bone.  An ultrasound. This may be done to check for an injury, such as a tear to a ligament or tendon.  How is this treated? This condition may be treated with:  Rest. You will need to limit activities that cause your symptoms to get worse or flare up.  Steroid or anti-inflammatory medicine. These medicines may be prescribed to help with pain and other symptoms.  A splint or brace. You may need to wear a splint or brace for support until your symptoms improve.  Physical therapy. This involves doing hand and arm exercises.  Surgery. This is usually done only if other treatments fail and symptoms continue for more than 6 months.  Follow these instructions at home: If you have a splint or brace:  Wear it as told by your health care provider. Remove it only as told by your health care provider.  Loosen the splint or brace if your fingers tingle, become numb, or turn cold and blue.  If your splint or brace is  not waterproof: ? Do not let it get wet. ? Cover it with a watertight covering when you take a bath or a shower.  Keep the splint or brace clean. Activity  Return to your normal activities as told by your health care provider. Ask your health care provider what activities are safe for you.  Do exercises as told by your health care provider. General instructions  Do not use any tobacco products, such as cigarettes, chewing tobacco, or e-cigarettes. Tobacco can delay healing. If you need help quitting, ask your health care provider.  Take over-the-counter and prescription medicines only as told by your  health care provider.  Keep all follow-up visits as told by your health care provider. This is important. How is this prevented?  Warm up and stretch before being active.  Cool down and stretch after being active.  Give your body time to rest between periods of activity.  Make sure to use equipment that fits you.  Be safe and responsible while being active to avoid falls.  Do at least 150 minutes of moderate-intensity exercise each week, such as brisk walking or water aerobics.  Maintain physical fitness, including: ? Strength. ? Flexibility. ? Cardiovascular fitness. ? Endurance. Contact a health care provider if:  Your symptoms do not improve in 4-6 weeks.  Your symptoms get worse. Get help right away if:  Your pain is severe.  You cannot move part of your hand or arm. This information is not intended to replace advice given to you by your health care provider. Make sure you discuss any questions you have with your health care provider. Document Released: 09/22/2005 Document Revised: 05/27/2016 Document Reviewed: 06/03/2015 Elsevier Interactive Patient Education  [employer].

## 2017-06-10 NOTE — Progress Notes (Signed)
[facility]  Patient name: Joel Hopkins MRN: [id number] DOB: [birth year] Sex: male   REQUESTING PROVIDER:    Dr. Georgina Snell   REASON FOR CONSULT:    Leg vein  HISTORY OF PRESENT ILLNESS:   Joel Hopkins is a 71 y.o. male, who is Referred today for possible blood clot in his leg.  The patient developed a fullness and enlargement mass around a superficial vein on his right thigh.  Over time, this has gotten softer and is not bothering him like it initially was.  He denies having any history of DVT.  He does not have any leg swelling.  He does have surface veins that are prominent on both legs.  The patient is a nonsmoker.  He used to work as a Administrator.  He does do frequent traveling to [place].    PAST MEDICAL HISTORY    Past Medical History:  Diagnosis Date  . Hypothyroid 09/21/2012     FAMILY HISTORY   Family History  Problem Relation Age of Onset  . Colon cancer Sister   . Diabetes Brother     SOCIAL HISTORY:   Social History   Social History  . Marital status: Married    Spouse name: N/A  . Number of children: N/A  . Years of education: N/A   Occupational History  . Not on file.   Social History Main Topics  . Smoking status: Never Smoker  . Smokeless tobacco: Never Used  . Alcohol use Yes  . Drug use: No  . Sexual activity: Yes   Other Topics Concern  . Not on file   Social History Narrative  . No narrative on file    ALLERGIES:    No Known Allergies  CURRENT MEDICATIONS:    Current Outpatient Prescriptions  Medication Sig Dispense Refill  . Levothyroxine Sodium 88 MCG CAPS Take 1 capsule (88 mcg total) by mouth daily before breakfast. 90 capsule 1   No current facility-administered medications for this visit.     REVIEW OF SYSTEMS:   [X]  denotes positive finding, [ ]  denotes negative finding Cardiac  Comments:  Chest pain or chest pressure:    Shortness of  breath upon exertion:    Short of breath when lying flat:    Irregular heart rhythm:        Vascular    Pain in calf, thigh, or hip brought on by ambulation:    Pain in feet at night that wakes you up from your sleep:     Blood clot in your veins:    Leg swelling:         Pulmonary    Oxygen at home:    Productive cough:     Wheezing:         Neurologic    Sudden weakness in arms or legs:     Sudden numbness in arms or legs:     Sudden onset of difficulty speaking or slurred speech:    Temporary loss of vision in one eye:     Problems with dizziness:         Gastrointestinal    Blood in stool:      Vomited blood:         Genitourinary    Burning when urinating:     Blood in urine:        Psychiatric    Major depression:         Hematologic    Bleeding  problems:    Problems with blood clotting too easily:        Skin    Rashes or ulcers:        Constitutional    Fever or chills:     PHYSICAL EXAM:   Vitals:   06/10/17 1338 06/10/17 1342  BP: (!) 143/96 (!) 148/94  Pulse: 73   Resp: 20   Temp: (!) 97.1 F (36.2 C)   TempSrc: Oral   SpO2: 97%   Weight: 203 lb 3.2 oz (92.2 kg)   Height: 6\' 1"  (1.854 m)     GENERAL: The patient is a well-nourished male, in no acute distress. The vital signs are documented above. CARDIAC: There is a regular rate and rhythm.  VASCULAR: Palpable pedal pulses.  No edema.  Prominent reticular / spider veins bilaterally.  The area of concern is more prominent but soft PULMONARY: Nonlabored respirations MUSCULOSKELETAL: There are no major deformities or cyanosis. NEUROLOGIC: No focal weakness or paresthesias are detected. SKIN: There are no ulcers or rashes noted. PSYCHIATRIC: The patient has a normal affect.  STUDIES:    Venous studies have been ordered and reviewed.  There is no evidence of DVT.  He does have reflux within the great saphenous vein on the right.  Maximum diameter the saphenofemoral junction is 0.69  cm  ASSESSMENT and PLAN    superficial thrombophlebitis with underlying venous insufficiency of the right saphenous vein: The patient is currently improving.  I would not recommend laser ablation at this time as I do not feel like his symptoms warrant this.  The superficial thrombophlebitis appears to be improving and resolving.  I have recommended wearing compression stockings for long traveling.  Again no immediate intervention is needed.  The patient will contact me if he has any further concerns or questions.   Annamarie Major, MD [facility] [phone] Pager [phone]

## 2017-06-30 ENCOUNTER — Ambulatory Visit (INDEPENDENT_AMBULATORY_CARE_PROVIDER_SITE_OTHER): Payer: Medicare HMO | Admitting: Family Medicine

## 2017-06-30 VITALS — BP 124/77 | HR 88 | Temp 97.9°F | Wt 202.0 lb

## 2017-06-30 DIAGNOSIS — J069 Acute upper respiratory infection, unspecified: Secondary | ICD-10-CM

## 2017-06-30 MED ORDER — GUAIFENESIN-CODEINE 100-10 MG/5ML PO SOLN: 5.0000 mL | Freq: Every evening | ORAL | 0 refills | Status: DC | PRN

## 2017-06-30 MED ORDER — IPRATROPIUM BROMIDE 0.06 % NA SOLN: 2.0000 | NASAL | 6 refills | Status: DC | PRN

## 2017-06-30 NOTE — Progress Notes (Signed)
Joel Hopkins is a 71 y.o. male who presents to [facility] today for sore throat cough congestion and runny nose. Patient notes symptoms ongoing now for several days. He denies any fevers or chills. He has tried some over-the-counter medications which have helped. He notes the cough is obnoxious and interferes with sleep.   Past Medical History:  Diagnosis Date  . Hypothyroid 09/21/2012   Past Surgical History:  Procedure Laterality Date  . APPENDECTOMY    . BACK SURGERY    . CATARACT EXTRACTION    . HERNIA REPAIR    . VASECTOMY     Social History  Substance Use Topics  . Smoking status: Never Smoker  . Smokeless tobacco: Never Used  . Alcohol use Yes   family history includes Colon cancer in his sister; Diabetes in his brother.  ROS as above:  Medications: Current Outpatient Prescriptions  Medication Sig Dispense Refill  . diclofenac sodium (VOLTAREN) 1 % GEL Apply 2 g topically 4 (four) times daily. To affected joint. 100 g 11  . Levothyroxine Sodium 88 MCG CAPS Take 1 capsule (88 mcg total) by mouth daily before breakfast. 90 capsule 1  . neomycin-polymyxin-hydrocortisone (CORTISPORIN) OTIC solution Place 3 drops into the left ear 4 (four) times daily. 10 mL 0  . guaiFENesin-codeine 100-10 MG/5ML syrup Take 5 mLs by mouth at bedtime as needed for cough. 120 mL 0  . ipratropium (ATROVENT) 0.06 % nasal spray Place 2 sprays into both nostrils every 4 (four) hours as needed for rhinitis. 10 mL 6   No current facility-administered medications for this visit.    No Known Allergies  Health Maintenance Health Maintenance  Topic Date Due  . INFLUENZA VACCINE  06/06/2029 (Originally 05/06/2017)  . COLONOSCOPY  08/12/2023  . TETANUS/TDAP  04/10/2027  . Hepatitis C Screening  Completed  . PNA vac Low Risk Adult  Completed     Exam:  BP 124/77   Pulse  88   Temp 97.9 F (36.6 C) (Oral)   Wt 202 lb (91.6 kg)   SpO2 96%   BMI 26.65 kg/m  Gen: Well NAD HEENT: EOMI,  MMM Clear nasal discharge. Posterior pharynx with cobblestoning normal tympanic membranes bilaterally. No significant cervical lymphadenopathy. Lungs: Normal work of breathing. CTABL Heart: RRR no MRG Abd: NABS, Soft. Nondistended, Nontender Exts: Brisk capillary refill, warm and well perfused.    No results found for this or any previous visit (from the past 72 hour(s)). No results found.    Assessment and Plan: 71 y.o. male with URI. Plan for symptomatic management with over-the-counter medications as well as Atrovent nasal spray and codeine cough syrup. Recheck in the near future if not improving.   No orders of the defined types were placed in this encounter.  Meds ordered this encounter  Medications  . guaiFENesin-codeine 100-10 MG/5ML syrup    Sig: Take 5 mLs by mouth at bedtime as needed for cough.    Dispense:  120 mL    Refill:  0  . ipratropium (ATROVENT) 0.06 % nasal spray    Sig: Place 2 sprays into both nostrils every 4 (four) hours as needed for rhinitis.    Dispense:  10 mL    Refill:  6     Discussed warning signs or symptoms. Please see discharge instructions. Patient expresses understanding.  I spent 25 minutes with this patient, greater than 50% was face-to-face time counseling regarding differential diagnosis  and treatment options.Marland Kitchen

## 2017-06-30 NOTE — Patient Instructions (Addendum)
Thank you for coming in today.  Take over the counter tylenol or ibuprofen or aleve for pain and fever.  USe the nasal spray for runny nose.  USe the cough syrup for cough at bedtime as needed.  Recheck with me as needed.    Upper Respiratory Infection, Adult Most upper respiratory infections (URIs) are a viral infection of the air passages leading to the lungs. A URI affects the nose, throat, and upper air passages. The most common type of URI is nasopharyngitis and is typically referred to as "the common cold." URIs run their course and usually go away on their own. Most of the time, a URI does not require medical attention, but sometimes a bacterial infection in the upper airways can follow a viral infection. This is called a secondary infection. Sinus and middle ear infections are common types of secondary upper respiratory infections. Bacterial pneumonia can also complicate a URI. A URI can worsen asthma and chronic obstructive pulmonary disease (COPD). Sometimes, these complications can require emergency medical care and may be life threatening. What are the causes? Almost all URIs are caused by viruses. A virus is a type of germ and can spread from one person to another. What increases the risk? You may be at risk for a URI if:  You smoke.  You have chronic heart or lung disease.  You have a weakened defense (immune) system.  You are very young or very old.  You have nasal allergies or asthma.  You work in crowded or poorly ventilated areas.  You work in health care facilities or schools.  What are the signs or symptoms? Symptoms typically develop 2-3 days after you come in contact with a cold virus. Most viral URIs last 7-10 days. However, viral URIs from the influenza virus (flu virus) can last 14-18 days and are typically more severe. Symptoms may include:  Runny or stuffy (congested) nose.  Sneezing.  Cough.  Sore throat.  Headache.  Fatigue.  Fever.  Loss  of appetite.  Pain in your forehead, behind your eyes, and over your cheekbones (sinus pain).  Muscle aches.  How is this diagnosed? Your health care provider may diagnose a URI by:  Physical exam.  Tests to check that your symptoms are not due to another condition such as: ? Strep throat. ? Sinusitis. ? Pneumonia. ? Asthma.  How is this treated? A URI goes away on its own with time. It cannot be cured with medicines, but medicines may be prescribed or recommended to relieve symptoms. Medicines may help:  Reduce your fever.  Reduce your cough.  Relieve nasal congestion.  Follow these instructions at home:  Take medicines only as directed by your health care provider.  Gargle warm saltwater or take cough drops to comfort your throat as directed by your health care provider.  Use a warm mist humidifier or inhale steam from a shower to increase air moisture. This may make it easier to breathe.  Drink enough fluid to keep your urine clear or pale yellow.  Eat soups and other clear broths and maintain good nutrition.  Rest as needed.  Return to work when your temperature has returned to normal or as your health care provider advises. You may need to stay home longer to avoid infecting others. You can also use a face mask and careful hand washing to prevent spread of the virus.  Increase the usage of your inhaler if you have asthma.  Do not use any tobacco products, including cigarettes,  chewing tobacco, or electronic cigarettes. If you need help quitting, ask your health care provider. How is this prevented? The best way to protect yourself from getting a cold is to practice good hygiene.  Avoid oral or hand contact with people with cold symptoms.  Wash your hands often if contact occurs.  There is no clear evidence that vitamin C, vitamin E, echinacea, or exercise reduces the chance of developing a cold. However, it is always recommended to get plenty of rest,  exercise, and practice good nutrition. Contact a health care provider if:  You are getting worse rather than better.  Your symptoms are not controlled by medicine.  You have chills.  You have worsening shortness of breath.  You have brown or red mucus.  You have yellow or brown nasal discharge.  You have pain in your face, especially when you bend forward.  You have a fever.  You have swollen neck glands.  You have pain while swallowing.  You have white areas in the back of your throat. Get help right away if:  You have severe or persistent: ? Headache. ? Ear pain. ? Sinus pain. ? Chest pain.  You have chronic lung disease and any of the following: ? Wheezing. ? Prolonged cough. ? Coughing up blood. ? A change in your usual mucus.  You have a stiff neck.  You have changes in your: ? Vision. ? Hearing. ? Thinking. ? Mood. This information is not intended to replace advice given to you by your health care provider. Make sure you discuss any questions you have with your health care provider. Document Released: 03/18/2001 Document Revised: 05/25/2016 Document Reviewed: 12/28/2013 Elsevier Interactive Patient Education  2017 [employer].

## 2017-07-09 ENCOUNTER — Ambulatory Visit: Payer: Medicare HMO | Admitting: Family Medicine

## 2017-10-20 DIAGNOSIS — L570 Actinic keratosis: Secondary | ICD-10-CM | POA: Diagnosis not present

## 2017-10-20 DIAGNOSIS — Z08 Encounter for follow-up examination after completed treatment for malignant neoplasm: Secondary | ICD-10-CM | POA: Diagnosis not present

## 2017-10-20 DIAGNOSIS — Z85820 Personal history of malignant melanoma of skin: Secondary | ICD-10-CM | POA: Diagnosis not present

## 2017-11-29 ENCOUNTER — Other Ambulatory Visit: Payer: Self-pay | Admitting: Family Medicine

## 2017-12-02 ENCOUNTER — Ambulatory Visit (INDEPENDENT_AMBULATORY_CARE_PROVIDER_SITE_OTHER): Payer: Medicare HMO | Admitting: Family Medicine

## 2017-12-02 ENCOUNTER — Encounter: Payer: Self-pay | Admitting: Family Medicine

## 2017-12-02 VITALS — BP 127/74 | HR 80 | Wt 207.0 lb

## 2017-12-02 DIAGNOSIS — R972 Elevated prostate specific antigen [PSA]: Secondary | ICD-10-CM

## 2017-12-02 DIAGNOSIS — N183 Chronic kidney disease, stage 3 unspecified: Secondary | ICD-10-CM

## 2017-12-02 DIAGNOSIS — D696 Thrombocytopenia, unspecified: Secondary | ICD-10-CM

## 2017-12-02 DIAGNOSIS — M79642 Pain in left hand: Secondary | ICD-10-CM

## 2017-12-02 DIAGNOSIS — R202 Paresthesia of skin: Secondary | ICD-10-CM

## 2017-12-02 DIAGNOSIS — E039 Hypothyroidism, unspecified: Secondary | ICD-10-CM | POA: Diagnosis not present

## 2017-12-02 DIAGNOSIS — M25512 Pain in left shoulder: Secondary | ICD-10-CM

## 2017-12-02 MED ORDER — LEVOTHYROXINE SODIUM 88 MCG PO TABS: 88.0000 ug | ORAL_TABLET | Freq: Every day | ORAL | 1 refills | Status: DC

## 2017-12-02 MED ORDER — GABAPENTIN 300 MG PO CAPS: 300.0000 mg | ORAL_CAPSULE | Freq: Every day | ORAL | 1 refills | Status: DC

## 2017-12-02 NOTE — Progress Notes (Signed)
Joel Hopkins is a 72 y.o. male who presents to [place]: [place] today for paresthesias left shoulder pain, left hand pain.  Joel Hopkins notes bothersome pins and needles sensation across his bilateral face and bilateral upper extremities.  This is been ongoing now for a few months and sometimes interferes with sleep.  He denies any weakness or numbness.  He has a relevant past surgical history for cervical fusion but denies any pain shooting down either of his arms.  He denies any weakness or numbness as well.  He denies any changes of medication and feels well otherwise with no fevers or chills.  Joel Hopkins to also notes left shoulder pain.  The pain is been present in the left lateral upper arm and anterior shoulder.  This is worse with overhead motion and reaching back.  As noted above he denies any significant pain radiating down his arms.  He had a trial of formal physical therapy and found to be only mildly helpful.  He is tried some over-the-counter medications as well.  He is reluctant to consider an injection today.  Left hand pain: [name] also notes stiffness and pain in his left hand especially at the index finger.  He is unable to close his fist fully.  He denies any triggering.  Symptoms have been ongoing for a few months without injury.  No fevers or chills.  Hypothyroidism: Joel Hopkins has a history of hypothyroidism which typically has been well controlled with levothyroxine.  He denies feeling too hot or too cold.  He notes paresthesias and joint stiffness as noted above.  He is going to run out of levothyroxine today.   Past Medical History:  Diagnosis Date  . Hypothyroid 09/21/2012   Past Surgical History:  Procedure Laterality Date  . APPENDECTOMY    . BACK SURGERY    . CATARACT EXTRACTION    . HERNIA REPAIR    . VASECTOMY     Social History   Tobacco Use  .  Smoking status: Never Smoker  . Smokeless tobacco: Never Used  Substance Use Topics  . Alcohol use: Yes   family history includes Colon cancer in his sister; Diabetes in his brother.  ROS as above:  Medications: Current Outpatient Medications  Medication Sig Dispense Refill  . diclofenac sodium (VOLTAREN) 1 % GEL Apply 2 g topically 4 (four) times daily. To affected joint. 100 g 11  . ipratropium (ATROVENT) 0.06 % nasal spray Place 2 sprays into both nostrils every 4 (four) hours as needed for rhinitis. 10 mL 6  . levothyroxine (SYNTHROID, LEVOTHROID) 88 MCG tablet Take 1 tablet (88 mcg total) by mouth daily. 90 tablet 1  . neomycin-polymyxin-hydrocortisone (CORTISPORIN) OTIC solution Place 3 drops into the left ear 4 (four) times daily. 10 mL 0  . gabapentin (NEURONTIN) 300 MG capsule Take 1 capsule (300 mg total) by mouth at bedtime. 90 capsule 1   No current facility-administered medications for this visit.    No Known Allergies  Health Maintenance Health Maintenance  Topic Date Due  . INFLUENZA VACCINE  06/06/2029 (Originally 05/06/2017)  . COLONOSCOPY  08/12/2023  . TETANUS/TDAP  04/10/2027  . Hepatitis C Screening  Completed  . PNA vac Low Risk Adult  Completed     Exam:  BP 127/74   Pulse 80   Wt 207 lb (93.9 kg)   BMI 27.31 kg/m  Gen: Well NAD HEENT: EOMI,  MMM nontender maxillary frontal sinuses.  No goiter. Lungs: Normal work of breathing. CTABL Heart: RRR no MRG Abd: NABS, Soft. Nondistended, Nontender Exts: Brisk capillary refill, warm and well perfused.   Left shoulder normal-appearing nontender. Full range of motion but pain with abduction.  Positive Hawkins and Neer's test. Positive empty can test. Strength is intact. Left hand: Normal-appearing Mildly tender to palpation second MCP PIP and DIP with some synovitis and stiffness.  Range of motion with flexion is limited.  Patient cannot fully flex his finger and has isolated lack of full flexion at the  MCP and PIP by about 10 degrees each. Neuro: Alert and oriented cranial nerves II through XII are intact with normal sensation.  Normal coordination and gait.   X-ray left hand and left shoulder have been ordered but not yet obtained.   Assessment and Plan: 72 y.o. male with  Paresthesias: Joel Hopkins describes pins and needles sensation across his body for several months now.  This is a bit obnoxious especially at bedtime.  The distribution does not seem to be limited to the extremities or along a dermatome and crosses the midline.  Etiology at this time is unclear.  His neurological exam is otherwise normal.  Plan for limited workup listed below to identify some of the common causes of paresthesias.  Additionally will try taking gabapentin at bedtime as his symptoms seem to be worse around sleep.  Recheck in a few months if not better.  Left shoulder pain: Very likely rotator cuff tendinopathy.  Patient has failed a 6-week trial of physical therapy.  Next step is x-ray and would typically be diagnostic and therapeutic subacromial injection.  Patient is reluctant to proceed with injection would like to try some watchful waiting.  He is interested to see if the gabapentin will help.  I am somewhat dubious but the gets reasonable.  Recheck in 2 months.  Left hand pain: DJD versus synovitis.  No evidence of trigger finger today.  X-ray pending but not yet obtained.  Plan for trial of existing diclofenac gel or Aspercreme.  Recheck in 2 months.  Hypothyroidism: TSH pending.  Levothyroxine refilled but will adjust if needed.  We will check creatinine given history of CKD.  We will also recheck PSA given history of elevated PSA and will recheck CBC given history of thrombocytopenia.   Orders Placed This Encounter  Procedures  . DG Shoulder Left    Standing Status:   Future    Standing Expiration Date:   01/31/2019    Order Specific Question:   Reason for Exam (SYMPTOM  OR DIAGNOSIS REQUIRED)     Answer:   eval left shoulder pain likley impingment or RTC tendonitnis    Order Specific Question:   Preferred imaging location?    Answer:   [facility]    Order Specific Question:   Radiology Contrast Protocol - do NOT remove file path    Answer:   [email or web address]  . DG Hand Complete Left    Standing Status:   Future    Standing Expiration Date:   01/31/2019    Order Specific Question:   Reason for Exam (SYMPTOM  OR DIAGNOSIS REQUIRED)    Answer:   eval left hand stiffness especially 2nd MCP and PIP and DIP    Order Specific Question:   Preferred imaging location?    Answer:   [facility]    Order Specific Question:   Radiology Contrast Protocol - do NOT remove file path    Answer:   \\[email or web address]  .  CBC  . COMPLETE METABOLIC PANEL WITH GFR  . TSH  . PSA  . Vitamin B12   Meds ordered this encounter  Medications  . gabapentin (NEURONTIN) 300 MG capsule    Sig: Take 1 capsule (300 mg total) by mouth at bedtime.    Dispense:  90 capsule    Refill:  1  . levothyroxine (SYNTHROID, LEVOTHROID) 88 MCG tablet    Sig: Take 1 tablet (88 mcg total) by mouth daily.    Dispense:  90 tablet    Refill:  1     Discussed warning signs or symptoms. Please see discharge instructions. Patient expresses understanding.

## 2017-12-02 NOTE — Patient Instructions (Addendum)
Thank you for coming in today. Get labs and xray now.  Try gabapentin.  Recheck in 2 months.  Return sooner if needed.   Gabapentin capsules or tablets What is this medicine? GABAPENTIN (GA ba pen tin) is used to control partial seizures in adults with epilepsy. It is also used to treat certain types of nerve pain. This medicine may be used for other purposes; ask your health care provider or pharmacist if you have questions. COMMON BRAND NAME(S): Active-PAC with Gabapentin, Gabarone, Neurontin What should I tell my health care provider before I take this medicine? They need to know if you have any of these conditions: -kidney disease -suicidal thoughts, plans, or attempt; a previous suicide attempt by you or a family member -an unusual or allergic reaction to gabapentin, other medicines, foods, dyes, or preservatives -pregnant or trying to get pregnant -breast-feeding How should I use this medicine? Take this medicine by mouth with a glass of water. Follow the directions on the prescription label. You can take it with or without food. If it upsets your stomach, take it with food.Take your medicine at regular intervals. Do not take it more often than directed. Do not stop taking except on your doctor's advice. If you are directed to break the 600 or 800 mg tablets in half as part of your dose, the extra half tablet should be used for the next dose. If you have not used the extra half tablet within 28 days, it should be thrown away. A special MedGuide will be given to you by the pharmacist with each prescription and refill. Be sure to read this information carefully each time. Talk to your pediatrician regarding the use of this medicine in children. Special care may be needed. Overdosage: If you think you have taken too much of this medicine contact a poison control center or emergency room at once. NOTE: This medicine is only for you. Do not share this medicine with others. What if I miss a  dose? If you miss a dose, take it as soon as you can. If it is almost time for your next dose, take only that dose. Do not take double or extra doses. What may interact with this medicine? Do not take this medicine with any of the following medications: -other gabapentin products This medicine may also interact with the following medications: -alcohol -antacids -antihistamines for allergy, cough and cold -certain medicines for anxiety or sleep -certain medicines for depression or psychotic disturbances -homatropine; hydrocodone -naproxen -narcotic medicines (opiates) for pain -phenothiazines like chlorpromazine, mesoridazine, prochlorperazine, thioridazine This list may not describe all possible interactions. Give your health care provider a list of all the medicines, herbs, non-prescription drugs, or dietary supplements you use. Also tell them if you smoke, drink alcohol, or use illegal drugs. Some items may interact with your medicine. What should I watch for while using this medicine? Visit your doctor or health care professional for regular checks on your progress. You may want to keep a record at home of how you feel your condition is responding to treatment. You may want to share this information with your doctor or health care professional at each visit. You should contact your doctor or health care professional if your seizures get worse or if you have any new types of seizures. Do not stop taking this medicine or any of your seizure medicines unless instructed by your doctor or health care professional. Stopping your medicine suddenly can increase your seizures or their severity. Wear a medical  identification bracelet or chain if you are taking this medicine for seizures, and carry a card that lists all your medications. You may get drowsy, dizzy, or have blurred vision. Do not drive, use machinery, or do anything that needs mental alertness until you know how this medicine affects you.  To reduce dizzy or fainting spells, do not sit or stand up quickly, especially if you are an older patient. Alcohol can increase drowsiness and dizziness. Avoid alcoholic drinks. Your mouth may get dry. Chewing sugarless gum or sucking hard candy, and drinking plenty of water will help. The use of this medicine may increase the chance of suicidal thoughts or actions. Pay special attention to how you are responding while on this medicine. Any worsening of mood, or thoughts of suicide or dying should be reported to your health care professional right away. Women who become pregnant while using this medicine may enroll in the [place] Pregnancy Registry by calling [phone]. This registry collects information about the safety of antiepileptic drug use during pregnancy. What side effects may I notice from receiving this medicine? Side effects that you should report to your doctor or health care professional as soon as possible: -allergic reactions like skin rash, itching or hives, swelling of the face, lips, or tongue -worsening of mood, thoughts or actions of suicide or dying Side effects that usually do not require medical attention (report to your doctor or health care professional if they continue or are bothersome): -constipation -difficulty walking or controlling muscle movements -dizziness -nausea -slurred speech -tiredness -tremors -weight gain This list may not describe all possible side effects. Call your doctor for medical advice about side effects. You may report side effects to FDA at 1-800-FDA-1088. Where should I keep my medicine? Keep out of reach of children. This medicine may cause accidental overdose and death if it taken by other adults, children, or pets. Mix any unused medicine with a substance like cat litter or coffee grounds. Then throw the medicine away in a sealed container like a sealed bag or a coffee can with a lid. Do not use the medicine  after the expiration date. Store at room temperature between 15 and 30 degrees C (59 and 86 degrees F). NOTE: This sheet is a summary. It may not cover all possible information. If you have questions about this medicine, talk to your doctor, pharmacist, or health care provider.  2018 Elsevier/Gold Standard (2013-11-18 15:26:50)

## 2017-12-03 ENCOUNTER — Other Ambulatory Visit: Payer: Self-pay | Admitting: Family Medicine

## 2017-12-03 DIAGNOSIS — R972 Elevated prostate specific antigen [PSA]: Secondary | ICD-10-CM

## 2017-12-03 LAB — COMPLETE METABOLIC PANEL WITH GFR
AG RATIO: 1.7 (calc) (ref 1.0–2.5)
ALBUMIN MSPROF: 4.1 g/dL (ref 3.6–5.1)
ALKALINE PHOSPHATASE (APISO): 60 U/L (ref 40–115)
ALT: 16 U/L (ref 9–46)
AST: 17 U/L (ref 10–35)
BUN / CREAT RATIO: 14 (calc) (ref 6–22)
BUN: 21 mg/dL (ref 7–25)
CO2: 29 mmol/L (ref 20–32)
Calcium: 9.2 mg/dL (ref 8.6–10.3)
Chloride: 106 mmol/L (ref 98–110)
Creat: 1.49 mg/dL — ABNORMAL HIGH (ref 0.70–1.18)
GFR, EST NON AFRICAN AMERICAN: 47 mL/min/{1.73_m2} — AB (ref >=60)
GFR, Est African American: 54 mL/min/{1.73_m2} — ABNORMAL LOW (ref >=60)
Globulin: 2.4 g/dL (calc) (ref 1.9–3.7)
Glucose, Bld: 81 mg/dL (ref 65–99)
POTASSIUM: 4.3 mmol/L (ref 3.5–5.3)
SODIUM: 140 mmol/L (ref 135–146)
Total Bilirubin: 0.6 mg/dL (ref 0.2–1.2)
Total Protein: 6.5 g/dL (ref 6.1–8.1)

## 2017-12-03 LAB — CBC
HCT: 42 % (ref 38.5–50.0)
Hemoglobin: 14.5 g/dL (ref 13.2–17.1)
MCH: 30.3 pg (ref 27.0–33.0)
MCHC: 34.5 g/dL (ref 32.0–36.0)
MCV: 87.9 fL (ref 80.0–100.0)
RBC: 4.78 10*6/uL (ref 4.20–5.80)
RDW: 12.7 % (ref 11.0–15.0)
WBC: 7.1 10*3/uL (ref 3.8–10.8)

## 2017-12-03 LAB — VITAMIN B12: Vitamin B-12: 329 pg/mL (ref 200–1100)

## 2017-12-03 LAB — TSH: TSH: 3.23 mIU/L (ref 0.40–4.50)

## 2017-12-03 LAB — PSA: PSA: 6.2 ng/mL — ABNORMAL HIGH (ref <=4.0)

## 2017-12-03 NOTE — Progress Notes (Signed)
e

## 2018-01-12 ENCOUNTER — Encounter: Payer: Self-pay | Admitting: Hematology & Oncology

## 2018-01-25 ENCOUNTER — Ambulatory Visit (INDEPENDENT_AMBULATORY_CARE_PROVIDER_SITE_OTHER): Payer: Medicare HMO

## 2018-01-25 ENCOUNTER — Other Ambulatory Visit: Payer: Self-pay

## 2018-01-25 ENCOUNTER — Ambulatory Visit (INDEPENDENT_AMBULATORY_CARE_PROVIDER_SITE_OTHER): Payer: Medicare HMO | Admitting: Family Medicine

## 2018-01-25 ENCOUNTER — Encounter: Payer: Self-pay | Admitting: Family Medicine

## 2018-01-25 VITALS — BP 145/82 | HR 73 | Resp 18 | Wt 203.0 lb

## 2018-01-25 DIAGNOSIS — M25512 Pain in left shoulder: Secondary | ICD-10-CM

## 2018-01-25 DIAGNOSIS — M19012 Primary osteoarthritis, left shoulder: Secondary | ICD-10-CM | POA: Diagnosis not present

## 2018-01-25 DIAGNOSIS — S80811A Abrasion, right lower leg, initial encounter: Secondary | ICD-10-CM

## 2018-01-25 MED ORDER — MUPIROCIN 2 % EX OINT: TOPICAL_OINTMENT | CUTANEOUS | 3 refills | Status: DC
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
## 2018-01-25 NOTE — Patient Instructions (Signed)
Thank you for coming in today. Get xray now.  Recheck with me in 6 weeks.  Return sooner if needed.  Apply the cream to the right leg wound until healed up.    Call or go to the ER if you develop a large red swollen joint with extreme pain or oozing puss.

## 2018-03-08 ENCOUNTER — Ambulatory Visit: Payer: Medicare HMO | Admitting: Family Medicine

## 2018-05-09 IMAGING — DX DG ELBOW COMPLETE 3+V*L*
4 series · 4 of 4 positions shown · non-contrast
Comparison: None

CLINICAL DATA: Posteromedial elbow pain for 1 month after hitting
elbow on an unknown object

EXAM:
LEFT ELBOW - COMPLETE 3+ VIEW

[elbow ap]
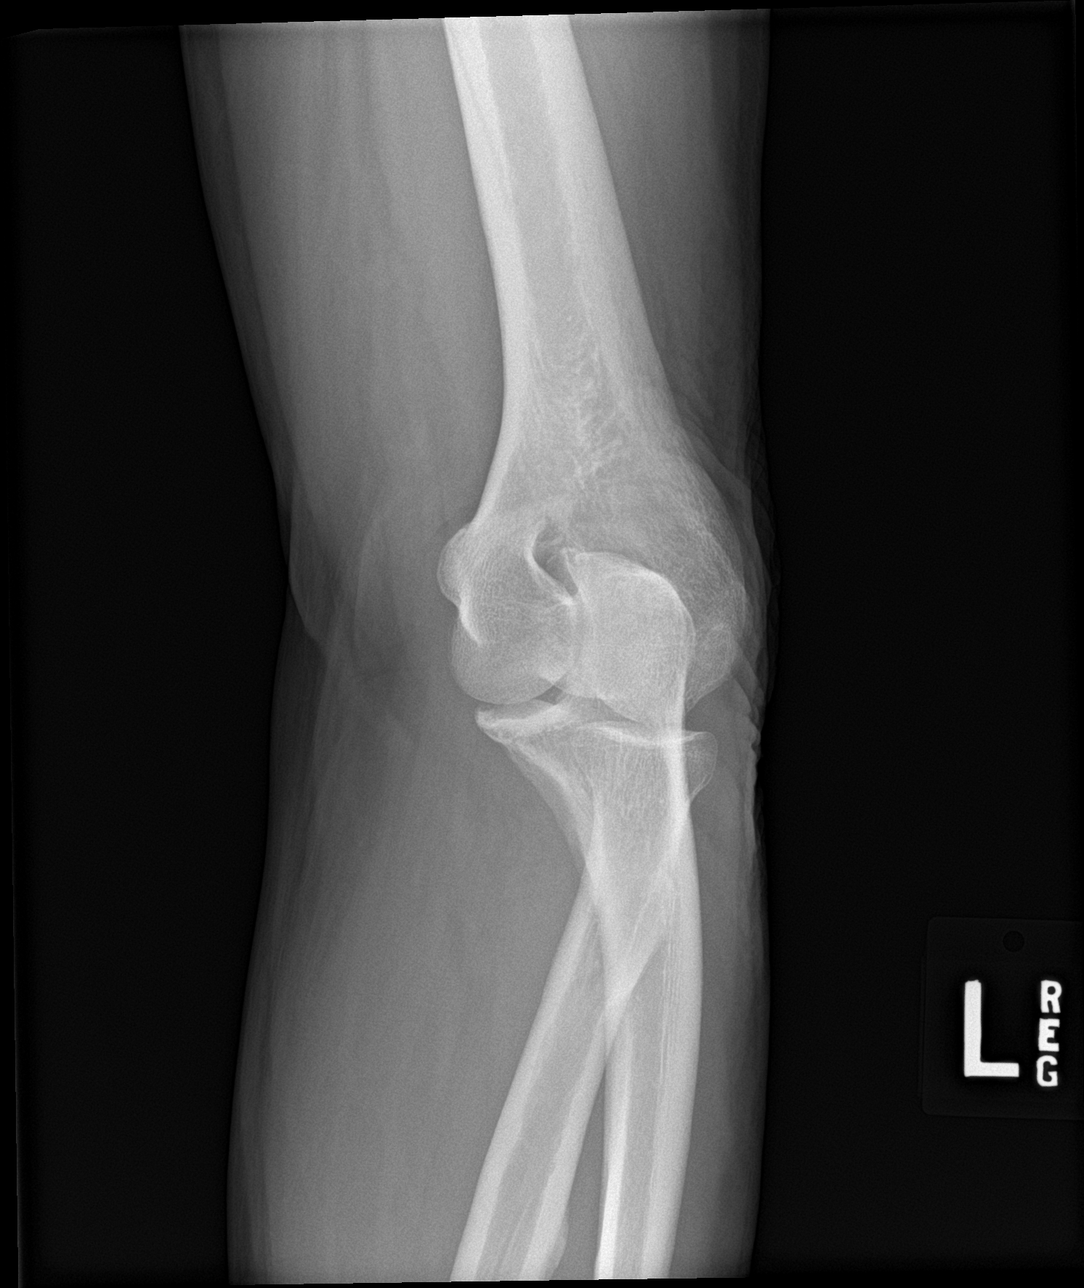

[elbow obl (1 of 2)]
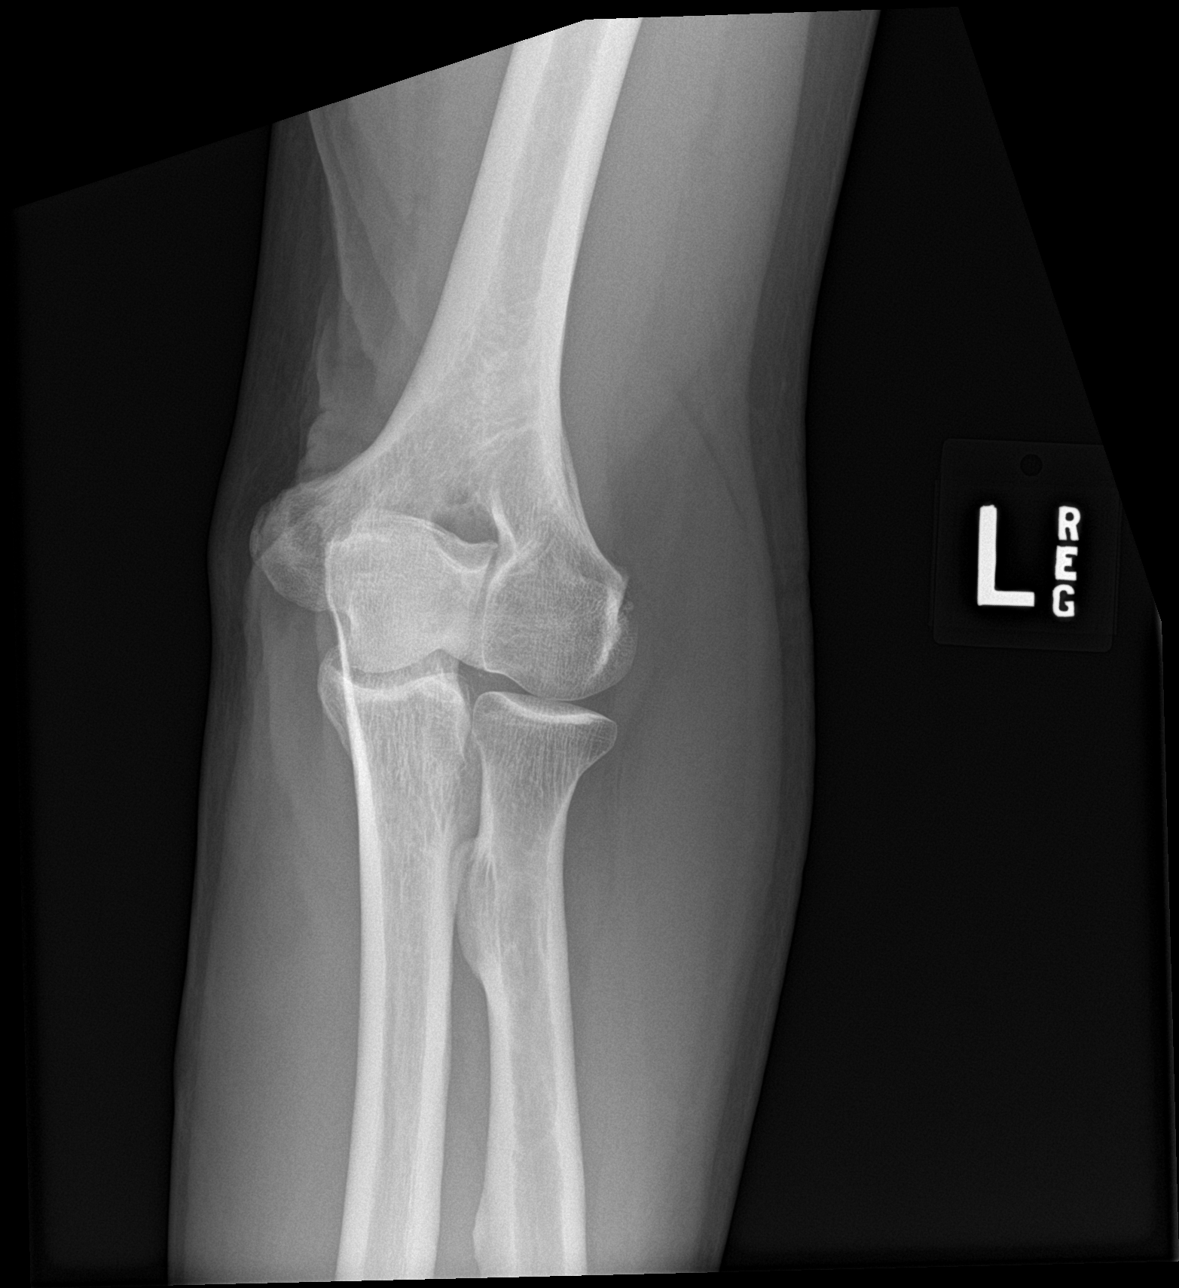

[elbow lat]
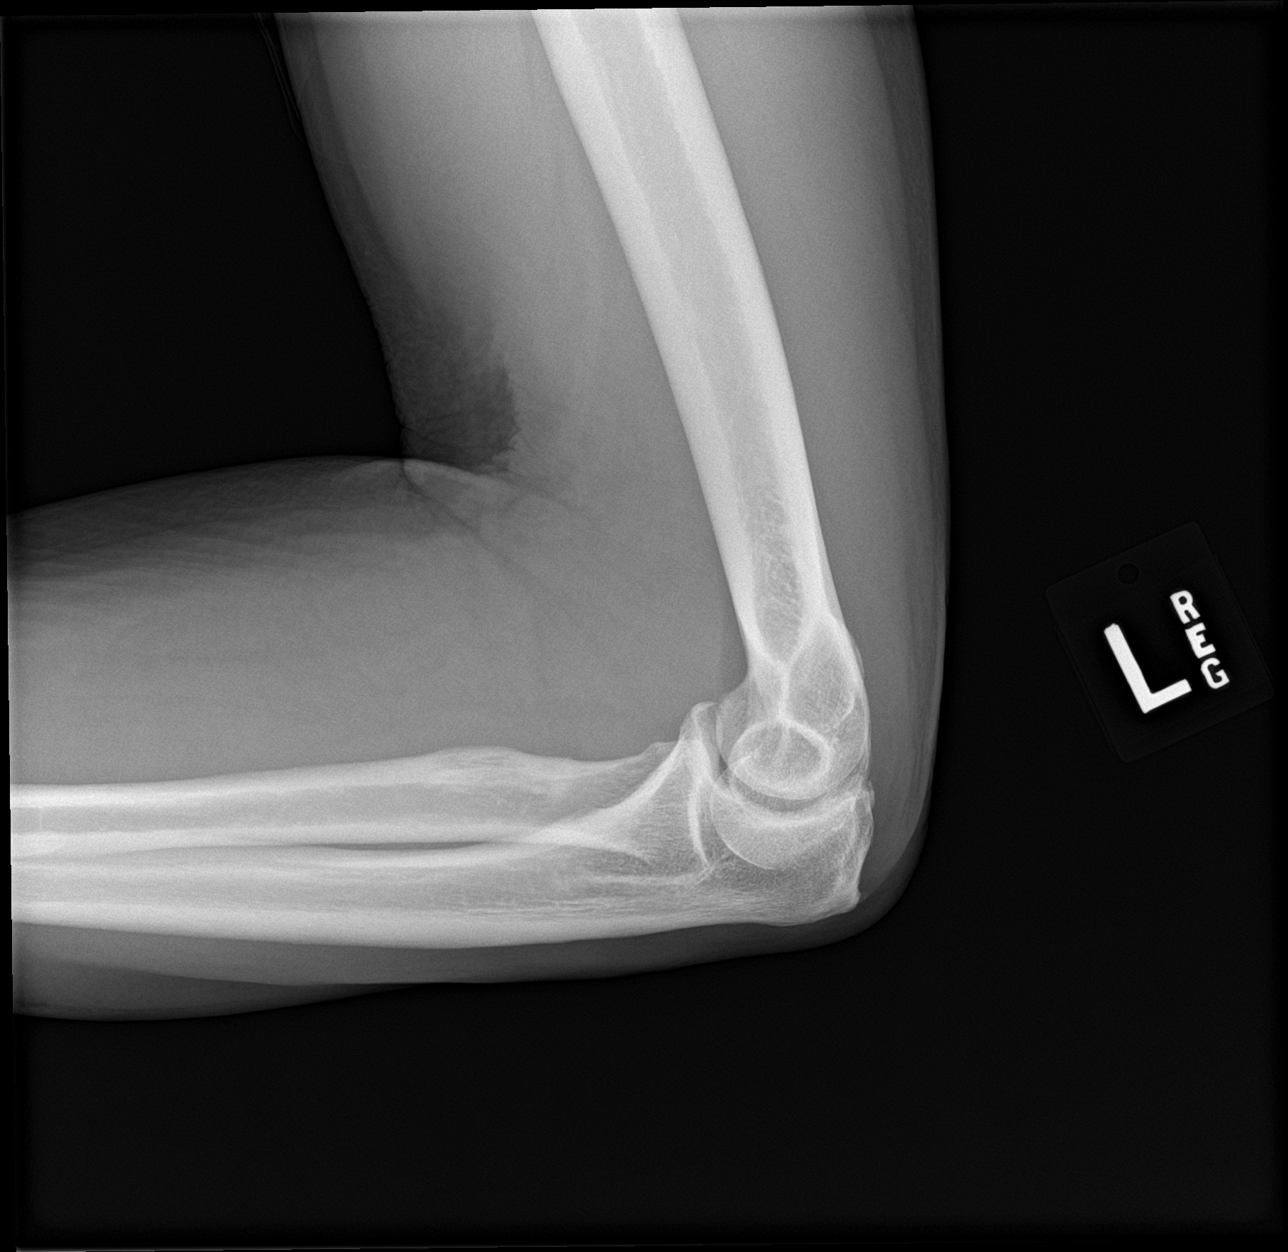

[elbow obl (2 of 2)]
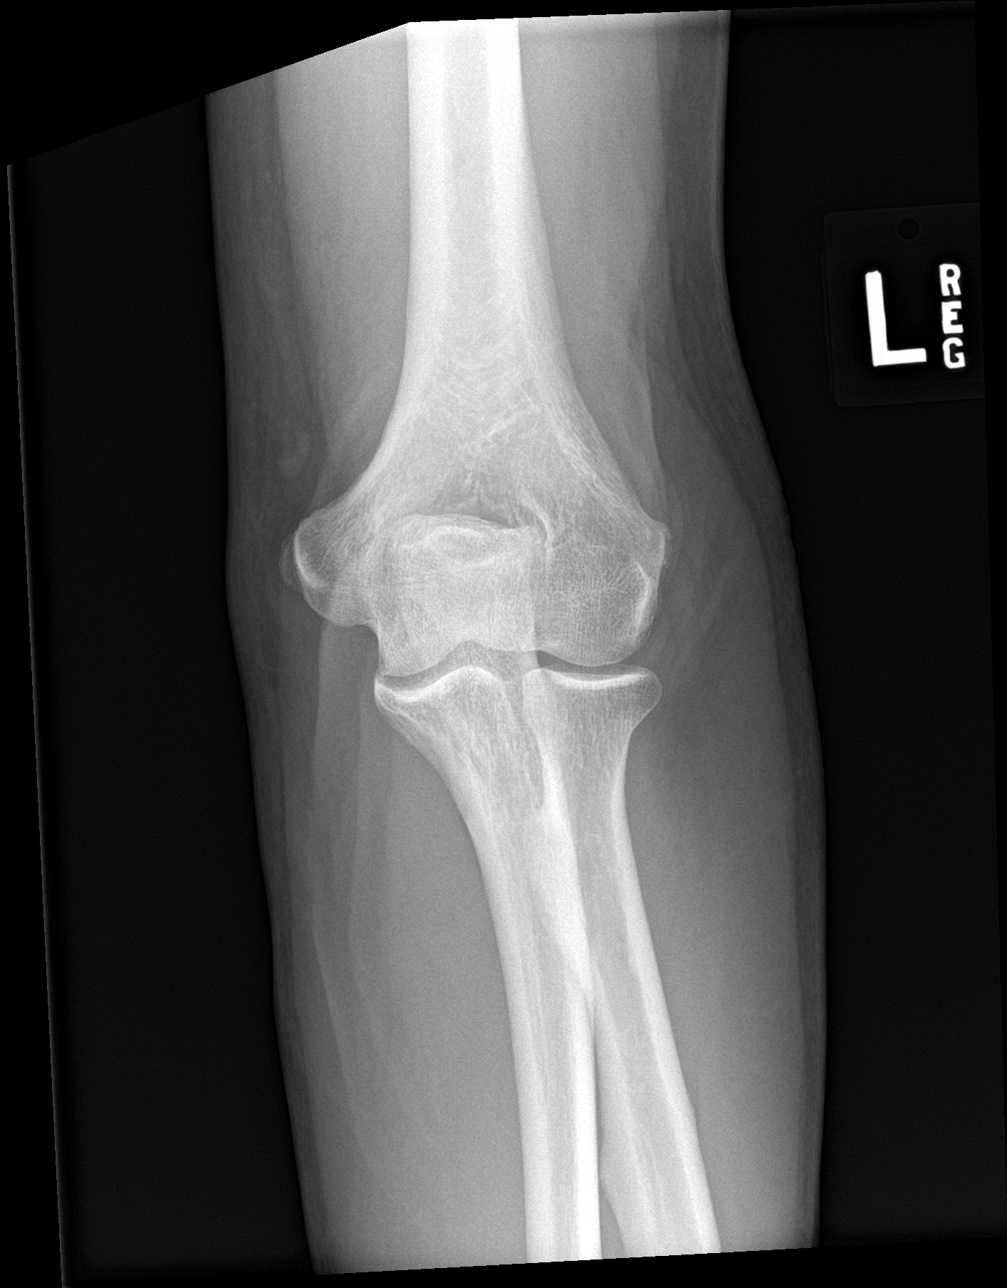

[4 of 4 positions shown; findings below may reference images not displayed]

FINDINGS: Osseous mineralization normal.

Joint spaces preserved.

No acute fracture, dislocation, or bone destruction.

No elbow joint effusion.
IMPRESSION: Normal exam.

## 2018-06-30 ENCOUNTER — Encounter: Payer: Self-pay | Admitting: Family Medicine

## 2018-06-30 ENCOUNTER — Ambulatory Visit (INDEPENDENT_AMBULATORY_CARE_PROVIDER_SITE_OTHER): Payer: Medicare HMO | Admitting: Family Medicine

## 2018-06-30 VITALS — BP 131/68 | HR 62 | Temp 98.2°F | Ht 72.0 in | Wt 203.0 lb

## 2018-06-30 DIAGNOSIS — R21 Rash and other nonspecific skin eruption: Secondary | ICD-10-CM | POA: Diagnosis not present

## 2018-06-30 DIAGNOSIS — Z23 Encounter for immunization: Secondary | ICD-10-CM

## 2018-06-30 MED ORDER — ZOSTER VAC RECOMB ADJUVANTED 50 MCG/0.5ML IM SUSR: 0.5000 mL | Freq: Once | INTRAMUSCULAR | 1 refills | Status: AC

## 2018-06-30 MED ORDER — TRIAMCINOLONE ACETONIDE 0.5 % EX CREA: 1.0000 "application " | TOPICAL_CREAM | Freq: Two times a day (BID) | CUTANEOUS | 3 refills | Status: DC

## 2018-06-30 NOTE — Progress Notes (Signed)
Joel Hopkins is a 72 y.o. male who presents to [place]: [place] today for rash.  Joel Hopkins notes a new rash on the left neck and anterior left chest wall present for about 2 weeks.  He cannot recall any new medications or exposures.  He notes his wife recently switched body soap.  He is not sure if that is caused a rash or not.  He notes the rash is itchy.  He is tried treating it with rubbing alcohol and continued aftershave.  He notes rubbing alcohol and aftershave cause burning sensation at the rash but have not helped it.  He denies fevers or chills nausea vomiting or diarrhea.  He denies tick bites.  He feels well otherwise.  He has a pertinent history for well-controlled hypothyroidism.   ROS as above:  Exam:  BP 131/68   Pulse 62   Temp 98.2 F (36.8 C) (Oral)   Ht 6' (1.829 m)   Wt 203 lb (92.1 kg)   BMI 27.53 kg/m  Wt Readings from Last 5 Encounters:  06/30/18 203 lb (92.1 kg)  01/25/18 203 lb (92.1 kg)  12/02/17 207 lb (93.9 kg)  06/30/17 202 lb (91.6 kg)  06/10/17 205 lb 4.8 oz (93.1 kg)    Gen: Well NAD HEENT: EOMI,  MMM Lungs: Normal work of breathing. CTABL Heart: RRR no MRG Abd: NABS, Soft. Nondistended, Nontender Exts: Brisk capillary refill, warm and well perfused.  Skin: Left neck and chest wall rash is macular with erythema.  Nontender.  Blanchable.  No induration or exudate present.  Lab and Radiology Results Lab Results  Component Value Date   TSH 3.23 12/02/2017      Assessment and Plan: 71 y.o. male with  Rash neck.  Unclear etiology.  Likely contact dermatitis.  Recommend hypoallergenic soaps and trial of triamcinolone cream.  If not improving recheck.  Patient does have a history of hypothyroidism but this is been well controlled.  If rash does not improve would recheck TSH.  We will administer influenza vaccine  today.  Prescribed Shingrix vaccine to pharmacy.   Orders Placed This Encounter  Procedures  . Flu Vaccine QUAD 36+ mos IM   Meds ordered this encounter  Medications  . Zoster Vaccine Adjuvanted [facility]) injection    Sig: Inject 0.5 mLs into the muscle once for 1 dose.    Dispense:  0.5 mL    Refill:  1    If given fax note to Dr Joel Hopkins [phone]  . triamcinolone cream (KENALOG) 0.5 %    Sig: Apply 1 application topically 2 (two) times daily. To affected areas.    Dispense:  30 g    Refill:  3     Historical information moved to improve visibility of documentation.  Past Medical History:  Diagnosis Date  . Hypothyroid 09/21/2012   Past Surgical History:  Procedure Laterality Date  . APPENDECTOMY    . BACK SURGERY    . CATARACT EXTRACTION    . HERNIA REPAIR    . VASECTOMY     Social History   Tobacco Use  . Smoking status: Never Smoker  . Smokeless tobacco: Never Used  Substance Use Topics  . Alcohol use: Yes   family history includes Colon cancer in his sister; Diabetes in his brother.  Medications: Current Outpatient Medications  Medication Sig Dispense Refill  . levothyroxine (SYNTHROID, LEVOTHROID) 88 MCG tablet Take 1 tablet (88 mcg total) by mouth  daily. 90 tablet 1  . triamcinolone cream (KENALOG) 0.5 % Apply 1 application topically 2 (two) times daily. To affected areas. 30 g 3  . Zoster Vaccine Adjuvanted [facility]) injection Inject 0.5 mLs into the muscle once for 1 dose. 0.5 mL 1   No current facility-administered medications for this visit.    No Known Allergies   Discussed warning signs or symptoms. Please see discharge instructions. Patient expresses understanding.

## 2018-06-30 NOTE — Patient Instructions (Signed)
Thank you for coming in today. Apply the cream to the rash twice daily.  Let me know if it does not get better.  Return sooner if needed.  I recommend using a hypoallergenic soap also seen as a sensitive skin soap.    Contact Dermatitis Dermatitis is redness, soreness, and swelling (inflammation) of the skin. Contact dermatitis is a reaction to certain substances that touch the skin. You either touched something that irritated your skin, or you have allergies to something you touched. Follow these instructions at home: [place] your skin as needed.  Apply cool compresses to the affected areas.  Try taking a bath with: ? Epsom salts. Follow the instructions on the package. You can get these at a pharmacy or grocery store. ? Baking soda. Pour a small amount into the bath as told by your doctor. ? Colloidal oatmeal. Follow the instructions on the package. You can get this at a pharmacy or grocery store.  Try applying baking soda paste to your skin. Stir water into baking soda until it looks like paste.  Do not scratch your skin.  Bathe less often.  Bathe in lukewarm water. Avoid using hot water. Medicines  Take or apply over-the-counter and prescription medicines only as told by your doctor.  If you were prescribed an antibiotic medicine, take or apply your antibiotic as told by your doctor. Do not stop taking the antibiotic even if your condition starts to get better. General instructions  Keep all follow-up visits as told by your doctor. This is important.  Avoid the substance that caused your reaction. If you do not know what caused it, keep a journal to try to track what caused it. Write down: ? What you eat. ? What cosmetic products you use. ? What you drink. ? What you wear in the affected area. This includes jewelry.  If you were given a bandage (dressing), take care of it as told by your doctor. This includes when to change and remove it. Contact a doctor  if:  You do not get better with treatment.  Your condition gets worse.  You have signs of infection such as: ? Swelling. ? Tenderness. ? Redness. ? Soreness. ? Warmth.  You have a fever.  You have new symptoms. Get help right away if:  You have a very bad headache.  You have neck pain.  Your neck is stiff.  You throw up (vomit).  You feel very sleepy.  You see red streaks coming from the affected area.  Your bone or joint underneath the affected area becomes painful after the skin has healed.  The affected area turns darker.  You have trouble breathing. This information is not intended to replace advice given to you by your health care provider. Make sure you discuss any questions you have with your health care provider. Document Released: 07/20/2009 Document Revised: 02/28/2016 Document Reviewed: 02/07/2015 Elsevier Interactive Patient Education  2018 [employer].

## 2018-09-04 ENCOUNTER — Other Ambulatory Visit: Payer: Self-pay | Admitting: Family Medicine

## 2018-09-22 ENCOUNTER — Encounter: Payer: Self-pay | Admitting: Family Medicine

## 2018-09-22 ENCOUNTER — Ambulatory Visit (INDEPENDENT_AMBULATORY_CARE_PROVIDER_SITE_OTHER): Payer: Medicare HMO | Admitting: Family Medicine

## 2018-09-22 VITALS — BP 131/80 | HR 79 | Temp 98.2°F | Ht 73.0 in | Wt 209.0 lb

## 2018-09-22 DIAGNOSIS — J040 Acute laryngitis: Secondary | ICD-10-CM | POA: Diagnosis not present

## 2018-09-22 MED ORDER — IPRATROPIUM BROMIDE 0.06 % NA SOLN: 2.0000 | NASAL | 6 refills | Status: DC | PRN

## 2018-09-22 MED ORDER — PREDNISONE 10 MG PO TABS: 30.0000 mg | ORAL_TABLET | Freq: Every day | ORAL | 0 refills | Status: DC

## 2018-09-22 MED ORDER — AZITHROMYCIN 250 MG PO TABS: 250.0000 mg | ORAL_TABLET | Freq: Every day | ORAL | 0 refills | Status: DC

## 2018-09-22 NOTE — Progress Notes (Signed)
Joel Hopkins is a 72 y.o. male who presents to [place]: [place] today for congestion and a tickling sensation in his throat.  Symptoms present for about 3 days.  He notes a mild hoarse voice as well.  He is tried some over-the-counter medicines which helped a bit.  He is traveling to [place] pretty soon and is worried that he may worsen while traveling.  He denies fevers or chills cough shortness of breath today.  No vomiting or diarrhea.   ROS as above:  Exam:  BP 131/80   Pulse 79   Temp 98.2 F (36.8 C) (Oral)   Ht 6\' 1"  (1.854 m)   Wt 209 lb (94.8 kg)   BMI 27.57 kg/m  Wt Readings from Last 5 Encounters:  09/22/18 209 lb (94.8 kg)  06/30/18 203 lb (92.1 kg)  01/25/18 203 lb (92.1 kg)  12/02/17 207 lb (93.9 kg)  06/30/17 202 lb (91.6 kg)    Gen: Well NAD HEENT: EOMI,  MMM posterior pharynx mild cobblestoning otherwise normal-appearing.  Mild inflamed nasal turbinates.  Mild cervical lymphadenopathy.  Normal tympanic membranes bilaterally. Lungs: Normal work of breathing. CTABL Heart: RRR no MRG Abd: NABS, Soft. Nondistended, Nontender Exts: Brisk capillary refill, warm and well perfused.   Lab and Radiology Results No results found for this or any previous visit (from the past 72 hour(s)). No results found.    Assessment and Plan: 72 y.o. male with laryngitis likely viral initially.  Plan for Mucinex and other over-the-counter medications.  Will use Atrovent nasal spray.  Backup prednisone if not improving and backup azithromycin if worsening.  Discussed how to use these medications and the importance of not taking the azithromycin if he does not worsen as likely it would not help today.   No orders of the defined types were placed in this encounter.  Meds ordered this encounter  Medications  . azithromycin (ZITHROMAX) 250 MG tablet    Sig: Take 1  tablet (250 mg total) by mouth daily. Take first 2 tablets together, then 1 every day until finished.    Dispense:  6 tablet    Refill:  0  . predniSONE (DELTASONE) 10 MG tablet    Sig: Take 3 tablets (30 mg total) by mouth daily with breakfast.    Dispense:  15 tablet    Refill:  0  . ipratropium (ATROVENT) 0.06 % nasal spray    Sig: Place 2 sprays into both nostrils every 4 (four) hours as needed.    Dispense:  10 mL    Refill:  6     Historical information moved to improve visibility of documentation.  Past Medical History:  Diagnosis Date  . Hypothyroid 09/21/2012   Past Surgical History:  Procedure Laterality Date  . APPENDECTOMY    . BACK SURGERY    . CATARACT EXTRACTION    . HERNIA REPAIR    . VASECTOMY     Social History   Tobacco Use  . Smoking status: Never Smoker  . Smokeless tobacco: Never Used  Substance Use Topics  . Alcohol use: Yes   family history includes Colon cancer in his sister; Diabetes in his brother.  Medications: Current Outpatient Medications  Medication Sig Dispense Refill  . levothyroxine (SYNTHROID, LEVOTHROID) 88 MCG tablet TAKE ONE TABLET BY MOUTH DAILY BEFORE BREAKFAST 30 tablet 1  . triamcinolone cream (KENALOG) 0.5 % Apply 1 application topically 2 (two) times daily. To affected  areas. 30 g 3  . azithromycin (ZITHROMAX) 250 MG tablet Take 1 tablet (250 mg total) by mouth daily. Take first 2 tablets together, then 1 every day until finished. 6 tablet 0  . ipratropium (ATROVENT) 0.06 % nasal spray Place 2 sprays into both nostrils every 4 (four) hours as needed. 10 mL 6  . predniSONE (DELTASONE) 10 MG tablet Take 3 tablets (30 mg total) by mouth daily with breakfast. 15 tablet 0   No current facility-administered medications for this visit.    No Known Allergies   Discussed warning signs or symptoms. Please see discharge instructions. Patient expresses understanding.

## 2018-09-22 NOTE — Patient Instructions (Signed)
Thank you for coming in today. Use over the counter muccinex DM twice daily Ok to also take tylenol for pain or fever.  Use the atrovent nasal spray to reduce drainage down the back of the throat.   If not getting better take prednisone   If worsening take azithromycin antibiotic.   Call or go to the emergency room if you get worse, have trouble breathing, have chest pains, or palpitations.    Laryngitis Laryngitis is irritation and swelling (inflammation) of your vocal cords. This condition causes symptoms such as:  A change in your voice. It may sound low and hoarse.  Loss of voice.  Coughing.  Sore throat.  Dry throat.  Stuffy nose. Depending on the cause, this condition may go away after a short time or may last for more than 3 weeks. Treatment often involves resting your voice and using medicines to soothe your throat. Follow these instructions at home: Medicines  Take over-the-counter and prescription medicines only as told by your doctor.  If you were prescribed an antibiotic medicine, take it as told by your doctor. Do not stop taking it even if you start to feel better. General instructions  Talk as little as possible. Also avoid whispering.  Write instead of talking. Do this until your voice is back to normal.  Drink enough fluid to keep your pee (urine) pale yellow.  Breathe in moist air. Use a humidifier if you live in a dry climate.  Do not use any products that have nicotine or tobacco in them, such as cigarettes and e-cigarettes. If you need help quitting, ask your doctor. Contact a doctor if:  You have a fever.  Your pain is worse.  Your symptoms do not get better in 2 weeks. Get help right away if:  You cough up blood.  You have trouble swallowing.  You have trouble breathing. Summary  Laryngitis is inflammation of your vocal cords.  This condition causes your voice to sound low and hoarse.  Rest your voice by talking as little as  possible. Also avoid whispering. This information is not intended to replace advice given to you by your health care provider. Make sure you discuss any questions you have with your health care provider. Document Released: 09/11/2011 Document Revised: 09/09/2017 Document Reviewed: 09/09/2017 Elsevier Interactive Patient Education  2019 [employer].

## 2018-10-13 NOTE — Progress Notes (Signed)
Subjective:   Joel Hopkins is a 73 y.o. male who presents for Medicare Annual/Subsequent preventive examination.  Review of Systems:  No ROS.  Medicare Wellness Visit. Additional risk factors are reflected in the social history.  Cardiac Risk Factors include: advanced age (>64men, >81 women);male gender;dyslipidemia  Sleep patterns: Getting 5-6 hours hours of sleep a night. Wakes up several times a night. Feels rested Home Safety/Smoke Alarms: Feels safe in home. Smoke alarms in place.  Living environment; lives with wifein split foyer home and steps have handrails in place. Shower is a step over tub and no grab bars in place. Seat Belt Safety/Bike Helmet: Wears seat belt.   Male:   CCS-  utd   PSA-  utd Lab Results  Component Value Date   PSA 6.2 (H) 12/02/2017   PSA 5.02 (H) 10/15/2015   PSA 5.49 (H) 01/16/2014        Objective:    Vitals: BP 123/72 (BP Location: Left Arm, Patient Position: Sitting, Cuff Size: Large)   Pulse 83   Ht 6\' 1"  (1.854 m)   Wt 205 lb (93 kg)   SpO2 98%   BMI 27.05 kg/m   Body mass index is 27.05 kg/m.  Advanced Directives 10/18/2018 06/10/2017 03/06/2017  Does Patient Have a Medical Advance Directive? No Yes No  Type of Advance Directive - [place];Living will -  Copy of [place] in Chart? - No - copy requested -  Would patient like information on creating a medical advance directive? Yes (MAU/Ambulatory/Procedural Areas - Information given) - -    Tobacco Social History   Tobacco Use  Smoking Status Never Smoker  Smokeless Tobacco Never Used     Counseling given: Not Answered   Clinical Intake:                       Past Medical History:  Diagnosis Date  . Hypothyroid 09/21/2012   Past Surgical History:  Procedure Laterality Date  . APPENDECTOMY    . BACK SURGERY    . CATARACT EXTRACTION    . HERNIA REPAIR    . VASECTOMY     Family History  Problem Relation Age  of Onset  . Colon cancer Sister   . Diabetes Brother    Social History   Socioeconomic History  . Marital status: Married    Spouse name: Joel Hopkins  . Number of children: 3  . Years of education: 18  . Highest education level: 12th grade  Occupational History  . 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 Financial resource strain: Not hard at all  . Food insecurity:    Worry: Never true    Inability: Never true  . Transportation needs:    Medical: No    Non-medical: No  Tobacco Use  . Smoking status: Never Smoker  . Smokeless tobacco: Never Used  Substance and Sexual Activity  . Alcohol use: Not Currently  . Drug use: No  . Sexual activity: Yes  Lifestyle  . Physical activity:    Days per week: 0 days    Minutes per session: 0 min  . Stress: Not at all  Relationships  . Social connections:    Talks on phone: More than three times a week    Gets together: Twice a week    Attends religious service: More than 4 times per year    Active member of club or organization:  Yes    Attends meetings of clubs or organizations: More than 4 times per year    Relationship status: Married  Other Topics Concern  . 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 Drinks coffee daily. Stays active in his work around the house    Outpatient Encounter Medications as of 10/18/2018  Medication Sig  . ipratropium (ATROVENT) 0.06 % nasal spray Place 2 sprays into both nostrils every 4 (four) hours as needed.  [place] Kitchen levothyroxine (SYNTHROID, LEVOTHROID) 88 MCG tablet TAKE ONE TABLET BY MOUTH DAILY BEFORE BREAKFAST  . [DISCONTINUED] azithromycin (ZITHROMAX) 250 MG tablet Take 1 tablet (250 mg total) by mouth daily. Take first 2 tablets together, then 1 every day until finished.  . [DISCONTINUED] predniSONE (DELTASONE) 10 MG tablet Take 3 tablets (30 mg total) by mouth daily with breakfast.  .  [DISCONTINUED] triamcinolone cream (KENALOG) 0.5 % Apply 1 application topically 2 (two) times daily. To affected areas.   No facility-administered encounter medications on file as of 10/18/2018.     Activities of Daily Living In your present state of health, do you have any difficulty performing the following activities: 10/18/2018  Hearing? Y  Comment has ringing in ears and from driving a truck messed with his hearing  Vision? N  Difficulty concentrating or making decisions? N  Walking or climbing stairs? N  Dressing or bathing? N  Doing errands, shopping? N  Preparing Food and eating ? N  Using the Toilet? N  In the past six months, have you accidently leaked urine? N  Do you have problems with loss of bowel control? N  Managing your Medications? N  Managing your Finances? N  Housekeeping or managing your Housekeeping? N  Some recent data might be hidden    Patient Care Team: Joel Hams, MD as PCP - General (Family Medicine)   Assessment:   This is a routine wellness examination for Joel Hopkins.Physical assessment deferred to PCP.   Exercise Activities and Dietary recommendations Current Exercise Habits: The patient has a physically strenuous job, but has no regular exercise apart from work. Diet eats healthy for the most part. Breakfast: sausage biscuit with egg and heese with tomato. Lunch: skips at times Dinner:   Meat and 2 vegetables . Drinks 2 glasses of water a day.   Goals    . Exercise 3x per week (30 min per time)     Increase exercise to walking 3 times a week for 30 minutes at a time.       Fall Risk Fall Risk  10/18/2018 06/30/2017 06/19/2016 01/16/2014  Falls in the past year? 0 No No No  Injury with Fall? 0 - - -  Follow up Falls prevention discussed - - -   Is the patient's home free of loose throw rugs in walkways, pet beds, electrical cords, etc?   yes      Grab bars in the bathroom? no      Handrails on the stairs?   yes      Adequate lighting?    yes   Depression Screen PHQ 2/9 Scores 10/18/2018 01/25/2018 06/10/2017 12/22/2016  PHQ - 2 Score 0 0 0 0  PHQ- 9 Score - 0 - -    Cognitive Function     6CIT Screen 10/18/2018  What Year? 0 points  What month? 0 points  What time? 0 points  Count back from 20 0 points  Months  in reverse 0 points  Repeat phrase 0 points  Total Score 0    Immunization History  Administered Date(s) Administered  . Influenza,inj,Quad PF,6+ Mos 06/30/2018  . Influenza-Unspecified 06/15/2014  . Pneumococcal Conjugate-13 10/15/2015  . Pneumococcal Polysaccharide-23 04/09/2017  . Tdap 04/09/2017, 05/04/2018  . Zoster 01/30/2016    Screening Tests Health Maintenance  Topic Date Due  . COLONOSCOPY  08/12/2023  . TETANUS/TDAP  05/04/2028  . INFLUENZA VACCINE  Completed  . Hepatitis C Screening  Completed  . PNA vac Low Risk Adult  Completed        Plan:    Mr. Premo , Thank you for taking time to come for your Medicare Wellness Visit. I appreciate your ongoing commitment to your health goals. Please review the following plan we discussed and let me know if I can assist you in the future. Please schedule your next medicare wellness visit with me in 1 yr. Increase water intake.   These are the goals we discussed: Goals    . Exercise 3x per week (30 min per time)     Increase exercise to walking 3 times a week for 30 minutes at a time.       This is a list of the screening recommended for you and due dates:  Health Maintenance  Topic Date Due  . Colon Cancer Screening  08/12/2023  . Tetanus Vaccine  05/04/2028  . Flu Shot  Completed  .  Hepatitis C: One time screening is recommended by Center for Disease Control  (CDC) for  adults born from [birth year] through 1964.   Completed  . Pneumonia vaccines  Completed     I have personally reviewed and noted the following in the patient's chart:   . Medical and social history . Use of alcohol, tobacco or illicit drugs  . Current medications  and supplements . Functional ability and status . Nutritional status . Physical activity . Advanced directives . List of other physicians . Hospitalizations, surgeries, and ER visits in previous 12 months . Vitals . Screenings to include cognitive, depression, and falls . Referrals and appointments  In addition, I have reviewed and discussed with patient certain preventive protocols, quality metrics, and best practice recommendations. A written personalized care plan for preventive services as well as general preventive health recommendations were provided to patient.     Joanne Chars, LPN  02/21/8415

## 2018-10-18 ENCOUNTER — Ambulatory Visit (INDEPENDENT_AMBULATORY_CARE_PROVIDER_SITE_OTHER): Payer: Medicare HMO | Admitting: *Deleted

## 2018-10-18 ENCOUNTER — Encounter: Payer: Self-pay | Admitting: Family Medicine

## 2018-10-18 VITALS — BP 123/72 | HR 83 | Ht 73.0 in | Wt 205.0 lb

## 2018-10-18 DIAGNOSIS — N183 Chronic kidney disease, stage 3 unspecified: Secondary | ICD-10-CM

## 2018-10-18 DIAGNOSIS — Z0000 Encounter for general adult medical examination without abnormal findings: Secondary | ICD-10-CM

## 2018-10-18 DIAGNOSIS — E039 Hypothyroidism, unspecified: Secondary | ICD-10-CM

## 2018-10-18 DIAGNOSIS — E785 Hyperlipidemia, unspecified: Secondary | ICD-10-CM

## 2018-10-18 DIAGNOSIS — R972 Elevated prostate specific antigen [PSA]: Secondary | ICD-10-CM

## 2018-10-18 NOTE — Patient Instructions (Signed)
Joel Hopkins , Thank you for taking time to come for your Medicare Wellness Visit. I appreciate your ongoing commitment to your health goals. Please review the following plan we discussed and let me know if I can assist you in the future. Please schedule your next medicare wellness visit with me in 1 yr. Increase water intake. These are the goals we discussed: Goals    . Exercise 3x per week (30 min per time)     Increase exercise to walking 3 times a week for 30 minutes at a time.     Health Maintenance After Age 4 After age 80, you are at a higher risk for certain long-term diseases and infections as well as injuries from falls. Falls are a major cause of broken bones and head injuries in people who are older than age 31. Getting regular preventive care can help to keep you healthy and well. Preventive care includes getting regular testing and making lifestyle changes as recommended by your health care provider. Talk with your health care provider about:  Which screenings and tests you should have. A screening is a test that checks for a disease when you have no symptoms.  A diet and exercise plan that is right for you. What should I know about screenings and tests to prevent falls? Screening and testing are the best ways to find a health problem early. Early diagnosis and treatment give you the best chance of managing medical conditions that are common after age 22. Certain conditions and lifestyle choices may make you more likely to have a fall. Your health care provider may recommend:  Regular vision checks. Poor vision and conditions such as cataracts can make you more likely to have a fall. If you wear glasses, make sure to get your prescription updated if your vision changes.  Medicine review. Work with your health care provider to regularly review all of the medicines you are taking, including over-the-counter medicines. Ask your health care provider about any side effects that may make  you more likely to have a fall. Tell your health care provider if any medicines that you take make you feel dizzy or sleepy.  Osteoporosis screening. Osteoporosis is a condition that causes the bones to get weaker. This can make the bones weak and cause them to break more easily.  Blood pressure screening. Blood pressure changes and medicines to control blood pressure can make you feel dizzy.  Strength and balance checks. Your health care provider may recommend certain tests to check your strength and balance while standing, walking, or changing positions.  Foot health exam. Foot pain and numbness, as well as not wearing proper footwear, can make you more likely to have a fall.  Depression screening. You may be more likely to have a fall if you have a fear of falling, feel emotionally low, or feel unable to do activities that you used to do.  Alcohol use screening. Using too much alcohol can affect your balance and may make you more likely to have a fall. What actions can I take to lower my risk of falls? General instructions  Talk with your health care provider about your risks for falling. Tell your health care provider if: ? You fall. Be sure to tell your health care provider about all falls, even ones that seem minor. ? You feel dizzy, sleepy, or off-balance.  Take over-the-counter and prescription medicines only as told by your health care provider. These include any supplements.  Eat a healthy diet  and maintain a healthy weight. A healthy diet includes low-fat dairy products, low-fat (lean) meats, and fiber from whole grains, beans, and lots of fruits and vegetables. Home safety  Remove any tripping hazards, such as rugs, cords, and clutter.  Install safety equipment such as grab bars in bathrooms and safety rails on stairs.  Keep rooms and walkways well-lit. Activity   Follow a regular exercise program to stay fit. This will help you maintain your balance. Ask your health care  provider what types of exercise are appropriate for you.  If you need a cane or walker, use it as recommended by your health care provider.  Wear supportive shoes that have nonskid soles. Lifestyle  Do not drink alcohol if your health care provider tells you not to drink.  If you drink alcohol, limit how much you have: ? 0-1 drink a day for women. ? 0-2 drinks a day for men.  Be aware of how much alcohol is in your drink. In the [place] one drink equals one typical bottle of beer (12 oz), one-half glass of wine (5 oz), or one shot of hard liquor (1 oz).  Do not use any products that contain nicotine or tobacco, such as cigarettes and e-cigarettes. If you need help quitting, ask your health care provider. Summary  Having a healthy lifestyle and getting preventive care can help to protect your health and wellness after age 25.  Screening and testing are the best way to find a health problem early and help you avoid having a fall. Early diagnosis and treatment give you the best chance for managing medical conditions that are more common for people who are older than age 51.  Falls are a major cause of broken bones and head injuries in people who are older than age 67. Take precautions to prevent a fall at home.  Work with your health care provider to learn what changes you can make to improve your health and wellness and to prevent falls. This information is not intended to replace advice given to you by your health care provider. Make sure you discuss any questions you have with your health care provider. Document Released: 08/05/2017 Document Revised: 08/05/2017 Document Reviewed: 08/05/2017 Elsevier Interactive Patient Education  2019 [employer].

## 2018-10-20 MED ORDER — LEVOTHYROXINE SODIUM 88 MCG PO TABS: ORAL_TABLET | ORAL | 1 refills | Status: DC

## 2018-10-26 DIAGNOSIS — L820 Inflamed seborrheic keratosis: Secondary | ICD-10-CM | POA: Diagnosis not present

## 2018-10-26 DIAGNOSIS — Z85820 Personal history of malignant melanoma of skin: Secondary | ICD-10-CM | POA: Diagnosis not present

## 2018-10-26 DIAGNOSIS — L300 Nummular dermatitis: Secondary | ICD-10-CM | POA: Diagnosis not present

## 2018-10-26 DIAGNOSIS — Z08 Encounter for follow-up examination after completed treatment for malignant neoplasm: Secondary | ICD-10-CM | POA: Diagnosis not present

## 2019-05-06 ENCOUNTER — Other Ambulatory Visit: Payer: Self-pay | Admitting: Family Medicine

## 2019-05-10 DIAGNOSIS — Z08 Encounter for follow-up examination after completed treatment for malignant neoplasm: Secondary | ICD-10-CM | POA: Diagnosis not present

## 2019-05-10 DIAGNOSIS — Z85820 Personal history of malignant melanoma of skin: Secondary | ICD-10-CM | POA: Diagnosis not present

## 2019-05-10 DIAGNOSIS — L821 Other seborrheic keratosis: Secondary | ICD-10-CM | POA: Diagnosis not present

## 2019-05-10 DIAGNOSIS — L820 Inflamed seborrheic keratosis: Secondary | ICD-10-CM | POA: Diagnosis not present

## 2019-08-01 ENCOUNTER — Other Ambulatory Visit: Payer: Self-pay | Admitting: Family Medicine

## 2019-09-18 ENCOUNTER — Emergency Department
Admission: EM | Admit: 2019-09-18 | Discharge: 2019-09-18 | Disposition: A | Discharge status: Home / Self Care | Payer: Medicare HMO | Source: Home / Self Care

## 2019-09-18 ENCOUNTER — Other Ambulatory Visit: Payer: Self-pay

## 2019-09-18 DIAGNOSIS — R05 Cough: Secondary | ICD-10-CM

## 2019-09-18 DIAGNOSIS — U071 COVID-19: Secondary | ICD-10-CM | POA: Diagnosis not present

## 2019-09-18 LAB — POC SARS CORONAVIRUS 2 AG -  ED: SARS Coronavirus 2 Ag: POSITIVE — AB

## 2019-09-18 NOTE — Discharge Instructions (Signed)
Return if any problems.

## 2019-09-18 NOTE — ED Triage Notes (Signed)
Pt here for covid testing after pos contact exposure last Tuesday; family friend. Both wearing a mask with 1hr contact. Started having body aches, nasal congestion on Friday.
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
## 2019-09-19 DIAGNOSIS — Z20828 Contact with and (suspected) exposure to other viral communicable diseases: Secondary | ICD-10-CM | POA: Diagnosis not present

## 2019-10-19 NOTE — Progress Notes (Signed)
Subjective:   Joel Hopkins is a 74 y.o. male who presents for Medicare Annual/Subsequent preventive examination.  Review of Systems:  No ROS.  Medicare Wellness Virtual Visit.  Visual/audio telehealth visit, UTA vital signs.   See social history for additional risk factors.    Cardiac Risk Factors include: advanced age (>78men, >75 women)  Sleep patterns: Getting 5 hours of sleep a night.Wakes up 1 time a night to void. Wakes up and feels rested and ready for the day.   Home Safety/Smoke Alarms: Feels safe in home. Smoke alarms in place.  Living environment; Lives with wife in 2 story home and hand rails are on the stairs. Shower is a step over tub and no grab bars in place. Seat Belt Safety/Bike Helmet: Wears seat belt.  Male:   CCS-  UTD   PSA- UTD Lab Results  Component Value Date   PSA 6.2 (H) 12/02/2017   PSA 5.02 (H) 10/15/2015   PSA 5.49 (H) 01/16/2014        Objective:    Vitals: There were no vitals taken for this visit.  There is no height or weight on file to calculate BMI.  Advanced Directives 10/24/2019 10/18/2018 06/10/2017 03/06/2017  Does Patient Have a Medical Advance Directive? Yes No Yes No  Type of Joel Hopkins;Living will - Joel Hopkins;Living will -  Does patient want to make changes to medical advance directive? No - Patient declined - - -  Copy of Joel Hopkins in Chart? No - copy requested - No - copy requested -  Would patient like information on creating a medical advance directive? - Yes (MAU/Ambulatory/Procedural Areas - Information given) - -    Tobacco Social History   Tobacco Use  Smoking Status Never Smoker  Smokeless Tobacco Never Used     Counseling given: Not Answered   Clinical Intake:  Pre-visit preparation completed: Yes  Pain : No/denies pain     Nutritional Risks: None Diabetes: No  How often do you need to have someone help you when you read  instructions, pamphlets, or other written materials from your doctor or pharmacy?: 1 - Never What is the last grade level you completed in school?: 12  Interpreter Needed?: No  Information entered by :: Joel Dakin, LPN  Past Medical History:  Diagnosis Date  . Hypothyroid 09/21/2012   Past Surgical History:  Procedure Laterality Date  . APPENDECTOMY    . BACK SURGERY    . CATARACT EXTRACTION    . HERNIA REPAIR    . VASECTOMY     Family History  Problem Relation Age of Onset  . Colon cancer Sister   . Diabetes Brother    Social History   Socioeconomic History  . Marital status: Married    Spouse name: Joel Hopkins  . Number of children: 3  . Years of education: 40  . Highest education level: 12th grade  Occupational History  . Occupation: retired    Comment: truck Geophysicist/field seismologist  Tobacco Use  . Smoking status: Never Smoker  . Smokeless tobacco: Never Used  Substance and Sexual Activity  . Alcohol use: Not Currently  . Drug use: No  . Sexual activity: Yes  Other Topics Concern  . 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 Drinks coffee daily. Stays active in his work around the house   Social Determinants of Health  Financial Resource Strain:   . Difficulty of Paying Living Expenses: Not on file  Food Insecurity:   . Worried About Charity fundraiser in the Last Year: Not on file  . Ran Out of Food in the Last Year: Not on file  Transportation Needs:   . Lack of Transportation (Medical): Not on file  . Lack of Transportation (Non-Medical): Not on file  Physical Activity:   . Days of Exercise per Week: Not on file  . Minutes of Exercise per Session: Not on file  Stress:   . Feeling of Stress : Not on file  Social Connections:   . Frequency of Communication with Friends and Family: Not on file  . Frequency of Social Gatherings with Friends and Family: Not on file  . Attends Religious  Services: Not on file  . Active Member of Clubs or Organizations: Not on file  . Attends Archivist Meetings: Not on file  . Marital Status: Not on file    Outpatient Encounter Medications as of 10/24/2019  Medication Sig  . levothyroxine (SYNTHROID) 88 MCG tablet TAKE ONE TABLET BY MOUTH DAILY BEFORE BREAKFAST  . ipratropium (ATROVENT) 0.06 % nasal spray Place 2 sprays into both nostrils every 4 (four) hours as needed. (Patient not taking: Reported on 10/24/2019)   No facility-administered encounter medications on file as of 10/24/2019.    Activities of Daily Living In your present state of health, do you have any difficulty performing the following activities: 10/24/2019  Hearing? Y  Comment has noticed some hearing loss  Vision? N  Difficulty concentrating or making decisions? N  Walking or climbing stairs? N  Dressing or bathing? N  Doing errands, shopping? N  Preparing Food and eating ? N  Using the Toilet? N  In the past six months, have you accidently leaked urine? N  Do you have problems with loss of bowel control? N  Managing your Medications? N  Managing your Finances? N  Housekeeping or managing your Housekeeping? N  Some recent data might be hidden    Patient Care Team: Gregor Hams, MD as PCP - General (Family Medicine)   Assessment:   This is a routine wellness examination for Joel Hopkins.Physical assessment deferred to PCP.   Exercise Activities and Dietary recommendations Current Exercise Habits: The patient has a physically strenuous job, but has no regular exercise apart from work., Exercise limited by: None identified Diet Eats a healthy diet of fruits, vegetables and proteins. Breakfast: Sausage egg and cheese Lunch: soda, crackers Dinner: Meat and vetegables. Drinks 8ounce bottle of water daily.      Goals    . DIET - INCREASE WATER INTAKE     Increase water intake to 32 ounces a day.    . Exercise 3x per week (30 min per time)     Increase  exercise to walking 3 times a week for 30 minutes at a time.       Fall Risk Fall Risk  10/24/2019 10/18/2018 06/30/2017 06/19/2016 01/16/2014  Falls in the past year? 0 0 No No No  Injury with Fall? 0 0 - - -  Risk for fall due to : No Fall Risks - - - -  Follow up Falls prevention discussed Falls prevention discussed - - -   Is the patient's home free of loose throw rugs in walkways, pet beds, electrical cords, etc?   yes      Grab bars in the bathroom? no  Handrails on the stairs?   yes      Adequate lighting?   yes   Depression Screen PHQ 2/9 Scores 10/24/2019 10/18/2018 01/25/2018 06/10/2017  PHQ - 2 Score 0 0 0 0  PHQ- 9 Score - - 0 -    Cognitive Function     6CIT Screen 10/24/2019 10/18/2018  What Year? 0 points 0 points  What month? 0 points 0 points  What time? 0 points 0 points  Count back from 20 0 points 0 points  Months in reverse 0 points 0 points  Repeat phrase 0 points 0 points  Total Score 0 0    Immunization History  Administered Date(s) Administered  . Influenza,inj,Quad PF,6+ Mos 06/30/2018  . Influenza-Unspecified 06/15/2014  . Pneumococcal Conjugate-13 10/15/2015  . Pneumococcal Polysaccharide-23 04/09/2017  . Tdap 04/09/2017, 05/04/2018  . Zoster 01/30/2016    Screening Tests Health Maintenance  Topic Date Due  . INFLUENZA VACCINE  05/07/2019  . COLONOSCOPY  08/12/2023  . TETANUS/TDAP  05/04/2028  . Hepatitis C Screening  Completed  . PNA vac Low Risk Adult  Completed        Plan:  Please schedule your next medicare wellness visit with me in 1 yr.  Mr. Kinsman , Thank you for taking time to come for your Medicare Wellness Visit. I appreciate your ongoing commitment to your health goals. Please review the following plan we discussed and let me know if I can assist you in the future.  Bring a copy of your living will and/or healthcare power of attorney to your next office visit.   These are the goals we discussed: Goals    . DIET -  INCREASE WATER INTAKE     Increase water intake to 32 ounces a day.    . Exercise 3x per week (30 min per time)     Increase exercise to walking 3 times a week for 30 minutes at a time.       This is a list of the screening recommended for you and due dates:  Health Maintenance  Topic Date Due  . Flu Shot  05/07/2019  . Colon Cancer Screening  08/12/2023  . Tetanus Vaccine  05/04/2028  .  Hepatitis C: One time screening is recommended by Center for Disease Control  (CDC) for  adults born from [birth year] through 1964.   Completed  . Pneumonia vaccines  Completed     I have personally reviewed and noted the following in the patient's chart:   . Medical and social history . Use of alcohol, tobacco or illicit drugs  . Current medications and supplements . Functional ability and status . Nutritional status . Physical activity . Advanced directives . List of other physicians . Hospitalizations, surgeries, and ER visits in previous 12 months . Vitals . Screenings to include cognitive, depression, and falls . Referrals and appointments  In addition, I have reviewed and discussed with patient certain preventive protocols, quality metrics, and best practice recommendations. A written personalized care plan for preventive services as well as general preventive health recommendations were provided to patient.     Joanne Chars, LPN  [phone]

## 2019-10-24 ENCOUNTER — Ambulatory Visit (INDEPENDENT_AMBULATORY_CARE_PROVIDER_SITE_OTHER): Payer: Medicare HMO | Admitting: *Deleted

## 2019-10-24 VITALS — BP 105/61 | HR 72 | Ht 73.0 in | Wt 200.0 lb

## 2019-10-24 DIAGNOSIS — Z0000 Encounter for general adult medical examination without abnormal findings: Secondary | ICD-10-CM

## 2019-10-24 NOTE — Patient Instructions (Addendum)
Please schedule your next medicare wellness visit with me in 1 yr.  Joel Hopkins , Thank you for taking time to come for your Medicare Wellness Visit. I appreciate your ongoing commitment to your health goals. Please review the following plan we discussed and let me know if I can assist you in the future.  Bring a copy of your living will and/or healthcare power of attorney to your next office visit. These are the goals we discussed: Goals    . DIET - INCREASE WATER INTAKE     Increase water intake to 32 ounces a day.    . Exercise 3x per week (30 min per time)     Increase exercise to walking 3 times a week for 30 minutes at a time.

## 2019-10-29 ENCOUNTER — Other Ambulatory Visit: Payer: Self-pay | Admitting: Family Medicine

## 2019-11-01 DIAGNOSIS — D692 Other nonthrombocytopenic purpura: Secondary | ICD-10-CM | POA: Diagnosis not present

## 2019-11-01 DIAGNOSIS — D239 Other benign neoplasm of skin, unspecified: Secondary | ICD-10-CM | POA: Diagnosis not present

## 2019-11-01 DIAGNOSIS — L905 Scar conditions and fibrosis of skin: Secondary | ICD-10-CM | POA: Diagnosis not present

## 2019-11-01 DIAGNOSIS — D485 Neoplasm of uncertain behavior of skin: Secondary | ICD-10-CM | POA: Diagnosis not present

## 2019-11-09 ENCOUNTER — Ambulatory Visit (INDEPENDENT_AMBULATORY_CARE_PROVIDER_SITE_OTHER): Payer: Medicare HMO | Admitting: Family Medicine

## 2019-11-09 ENCOUNTER — Encounter: Payer: Self-pay | Admitting: Family Medicine

## 2019-11-09 VITALS — BP 115/72 | HR 80 | Temp 98.2°F | Ht 67.0 in | Wt 198.0 lb

## 2019-11-09 DIAGNOSIS — R972 Elevated prostate specific antigen [PSA]: Secondary | ICD-10-CM

## 2019-11-09 DIAGNOSIS — D696 Thrombocytopenia, unspecified: Secondary | ICD-10-CM

## 2019-11-09 DIAGNOSIS — E785 Hyperlipidemia, unspecified: Secondary | ICD-10-CM

## 2019-11-09 DIAGNOSIS — E039 Hypothyroidism, unspecified: Secondary | ICD-10-CM | POA: Diagnosis not present

## 2019-11-09 NOTE — Assessment & Plan Note (Signed)
Update CBC. 

## 2019-11-09 NOTE — Assessment & Plan Note (Signed)
Update lipid panel.  

## 2019-11-09 NOTE — Patient Instructions (Signed)
Great to meet you today! Have your labs completed today, we'll be in touch with results.

## 2019-11-09 NOTE — Assessment & Plan Note (Signed)
Updated PSA ordered. °
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
## 2019-11-09 NOTE — Assessment & Plan Note (Signed)
Currently stable Update TSH

## 2019-11-10 LAB — CBC
HCT: 42.6 % (ref 38.5–50.0)
Hemoglobin: 14.4 g/dL (ref 13.2–17.1)
MCH: 31.4 pg (ref 27.0–33.0)
MCHC: 33.8 g/dL (ref 32.0–36.0)
MCV: 92.8 fL (ref 80.0–100.0)
MPV: 11.4 fL (ref 7.5–12.5)
Platelets: 74 10*3/uL — ABNORMAL LOW (ref 140–400)
RBC: 4.59 10*6/uL (ref 4.20–5.80)
RDW: 13 % (ref 11.0–15.0)
WBC: 8.2 10*3/uL (ref 3.8–10.8)

## 2019-11-10 LAB — COMPLETE METABOLIC PANEL WITH GFR
AG Ratio: 1.8 (calc) (ref 1.0–2.5)
ALT: 19 U/L (ref 9–46)
AST: 18 U/L (ref 10–35)
Albumin: 4.4 g/dL (ref 3.6–5.1)
Alkaline phosphatase (APISO): 54 U/L (ref 35–144)
BUN/Creatinine Ratio: 16 (calc) (ref 6–22)
BUN: 23 mg/dL (ref 7–25)
CO2: 27 mmol/L (ref 20–32)
Calcium: 9.5 mg/dL (ref 8.6–10.3)
Chloride: 107 mmol/L (ref 98–110)
Creat: 1.47 mg/dL — ABNORMAL HIGH (ref 0.70–1.18)
GFR, Est African American: 54 mL/min/{1.73_m2} — ABNORMAL LOW (ref >=60)
GFR, Est Non African American: 47 mL/min/{1.73_m2} — ABNORMAL LOW (ref >=60)
Globulin: 2.5 g/dL (calc) (ref 1.9–3.7)
Glucose, Bld: 89 mg/dL (ref 65–99)
Potassium: 4.5 mmol/L (ref 3.5–5.3)
Sodium: 142 mmol/L (ref 135–146)
Total Bilirubin: 0.5 mg/dL (ref 0.2–1.2)
Total Protein: 6.9 g/dL (ref 6.1–8.1)

## 2019-11-10 LAB — LIPID PANEL
Cholesterol: 210 mg/dL — ABNORMAL HIGH (ref <200)
HDL: 55 mg/dL (ref >=40)
LDL Cholesterol (Calc): 141 mg/dL (calc) — ABNORMAL HIGH
Non-HDL Cholesterol (Calc): 155 mg/dL (calc) — ABNORMAL HIGH (ref <130)
Total CHOL/HDL Ratio: 3.8 (calc) (ref <5.0)
Triglycerides: 57 mg/dL (ref <150)

## 2019-11-10 LAB — TSH: TSH: 2.38 mIU/L (ref 0.40–4.50)

## 2019-11-10 LAB — PSA: PSA: 6.3 ng/mL — ABNORMAL HIGH (ref <=4.0)

## 2019-12-19 ENCOUNTER — Encounter: Payer: Self-pay | Admitting: Family Medicine

## 2019-12-28 DIAGNOSIS — Z1159 Encounter for screening for other viral diseases: Secondary | ICD-10-CM | POA: Diagnosis not present

## 2020-02-04 ENCOUNTER — Other Ambulatory Visit: Payer: Self-pay | Admitting: Physician Assistant

## 2020-05-06 ENCOUNTER — Other Ambulatory Visit: Payer: Self-pay | Admitting: Family Medicine

## 2020-05-22 DIAGNOSIS — L820 Inflamed seborrheic keratosis: Secondary | ICD-10-CM | POA: Diagnosis not present

## 2020-05-22 DIAGNOSIS — L821 Other seborrheic keratosis: Secondary | ICD-10-CM | POA: Diagnosis not present

## 2020-05-22 DIAGNOSIS — L918 Other hypertrophic disorders of the skin: Secondary | ICD-10-CM | POA: Diagnosis not present

## 2020-05-22 DIAGNOSIS — L570 Actinic keratosis: Secondary | ICD-10-CM | POA: Diagnosis not present

## 2020-05-22 DIAGNOSIS — Z85820 Personal history of malignant melanoma of skin: Secondary | ICD-10-CM | POA: Diagnosis not present

## 2020-08-05 ENCOUNTER — Other Ambulatory Visit: Payer: Self-pay | Admitting: Family Medicine

## 2020-09-07 ENCOUNTER — Ambulatory Visit: Payer: Self-pay | Attending: Internal Medicine

## 2020-09-07 DIAGNOSIS — Z23 Encounter for immunization: Secondary | ICD-10-CM

## 2020-09-07 NOTE — Progress Notes (Signed)
   Covid-19 Vaccination Clinic  Name:  [facility] Kleinsasser    MRN: [id number] DOB: 04/11/1946  09/07/2020  Mr. Anfinson was observed post Covid-19 immunization for 15 minutes without incident. He was provided with Vaccine Information Sheet and instruction to access the V-Safe system.   Mr. Duffey was instructed to call 911 with any severe reactions post vaccine: Marland Kitchen Difficulty breathing  . Swelling of face and throat  . A fast heartbeat  . A bad rash all over body  . Dizziness and weakness   Immunizations Administered    Name Date Dose VIS Date Route   JANSSEN COVID-19 VACCINE 09/07/2020  4:33 PM 0.5 mL 07/25/2020 Intramuscular   Manufacturer: [employer]   Lot: [id number]   [place]: [phone]

## 2020-09-18 ENCOUNTER — Encounter: Payer: Self-pay | Admitting: Family Medicine

## 2020-10-26 ENCOUNTER — Ambulatory Visit (INDEPENDENT_AMBULATORY_CARE_PROVIDER_SITE_OTHER): Payer: Medicare HMO | Admitting: Physician Assistant

## 2020-10-26 ENCOUNTER — Encounter: Payer: Self-pay | Admitting: Physician Assistant

## 2020-10-26 ENCOUNTER — Other Ambulatory Visit: Payer: Self-pay

## 2020-10-26 VITALS — BP 139/78 | HR 70 | Wt 204.5 lb

## 2020-10-26 DIAGNOSIS — R000 Tachycardia, unspecified: Secondary | ICD-10-CM

## 2020-10-26 DIAGNOSIS — R059 Cough, unspecified: Secondary | ICD-10-CM | POA: Diagnosis not present

## 2020-10-26 DIAGNOSIS — J0140 Acute pansinusitis, unspecified: Secondary | ICD-10-CM | POA: Diagnosis not present

## 2020-10-26 DIAGNOSIS — R5381 Other malaise: Secondary | ICD-10-CM

## 2020-10-26 MED ORDER — AZITHROMYCIN 250 MG PO TABS: ORAL_TABLET | ORAL | 0 refills | Status: DC
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
## 2020-10-26 NOTE — Patient Instructions (Signed)

## 2020-10-29 ENCOUNTER — Ambulatory Visit (INDEPENDENT_AMBULATORY_CARE_PROVIDER_SITE_OTHER): Payer: Medicare HMO | Admitting: Family Medicine

## 2020-10-29 ENCOUNTER — Telehealth: Payer: Self-pay | Admitting: General Practice

## 2020-10-29 ENCOUNTER — Other Ambulatory Visit: Payer: Self-pay | Admitting: Family Medicine

## 2020-10-29 DIAGNOSIS — N1831 Chronic kidney disease, stage 3a: Secondary | ICD-10-CM

## 2020-10-29 DIAGNOSIS — Z0000 Encounter for general adult medical examination without abnormal findings: Secondary | ICD-10-CM | POA: Diagnosis not present

## 2020-10-29 DIAGNOSIS — E785 Hyperlipidemia, unspecified: Secondary | ICD-10-CM

## 2020-10-29 DIAGNOSIS — R972 Elevated prostate specific antigen [PSA]: Secondary | ICD-10-CM

## 2020-10-29 DIAGNOSIS — R000 Tachycardia, unspecified: Secondary | ICD-10-CM | POA: Insufficient documentation

## 2020-10-29 DIAGNOSIS — E039 Hypothyroidism, unspecified: Secondary | ICD-10-CM

## 2020-10-29 NOTE — Patient Instructions (Addendum)
Health Maintenance, Male Adopting a healthy lifestyle and getting preventive care are important in promoting health and wellness. Ask your health care provider about:  The right schedule for you to have regular tests and exams.  Things you can do on your own to prevent diseases and keep yourself healthy. What should I know about diet, weight, and exercise? Eat a healthy diet  Eat a diet that includes plenty of vegetables, fruits, low-fat dairy products, and lean protein.  Do not eat a lot of foods that are high in solid fats, added sugars, or sodium.   Maintain a healthy weight Body mass index (BMI) is a measurement that can be used to identify possible weight problems. It estimates body fat based on height and weight. Your health care provider can help determine your BMI and help you achieve or maintain a healthy weight. Get regular exercise Get regular exercise. This is one of the most important things you can do for your health. Most adults should:  Exercise for at least 150 minutes each week. The exercise should increase your heart rate and make you sweat (moderate-intensity exercise).  Do strengthening exercises at least twice a week. This is in addition to the moderate-intensity exercise.  Spend less time sitting. Even light physical activity can be beneficial. Watch cholesterol and blood lipids Have your blood tested for lipids and cholesterol at 74 years of age, then have this test every 5 years. You may need to have your cholesterol levels checked more often if:  Your lipid or cholesterol levels are high.  You are older than 74 years of age.  You are at high risk for heart disease. What should I know about cancer screening? Many types of cancers can be detected early and may often be prevented. Depending on your health history and family history, you may need to have cancer screening at various ages. This may include screening for:  Colorectal cancer.  Prostate  cancer.  Skin cancer.  Lung cancer. What should I know about heart disease, diabetes, and high blood pressure? Blood pressure and heart disease  High blood pressure causes heart disease and increases the risk of stroke. This is more likely to develop in people who have high blood pressure readings, are of African descent, or are overweight.  Talk with your health care provider about your target blood pressure readings.  Have your blood pressure checked: ? Every 3-5 years if you are 55-22 years of age. ? Every year if you are 41 years old or older.  If you are between the ages of 16 and 56 and are a current or former smoker, ask your health care provider if you should have a one-time screening for abdominal aortic aneurysm (AAA). Diabetes Have regular diabetes screenings. This checks your fasting blood sugar level. Have the screening done:  Once every three years after age 70 if you are at a normal weight and have a low risk for diabetes.  More often and at a younger age if you are overweight or have a high risk for diabetes. What should I know about preventing infection? Hepatitis B If you have a higher risk for hepatitis B, you should be screened for this virus. Talk with your health care provider to find out if you are at risk for hepatitis B infection. Hepatitis C Blood testing is recommended for:  Everyone born from [birth year] through 1964.  Anyone with known risk factors for hepatitis C. Sexually transmitted infections (STIs)  You should be screened  each year for STIs, including gonorrhea and chlamydia, if: ? You are sexually active and are younger than 74 years of age. ? You are older than 74 years of age and your health care provider tells you that you are at risk for this type of infection. ? Your sexual activity has changed since you were last screened, and you are at increased risk for chlamydia or gonorrhea. Ask your health care provider if you are at risk.  Ask your  health care provider about whether you are at high risk for HIV. Your health care provider may recommend a prescription medicine to help prevent HIV infection. If you choose to take medicine to prevent HIV, you should first get tested for HIV. You should then be tested every 3 months for as long as you are taking the medicine. Follow these instructions at home: Lifestyle  Do not use any products that contain nicotine or tobacco, such as cigarettes, e-cigarettes, and chewing tobacco. If you need help quitting, ask your health care provider.  Do not use street drugs.  Do not share needles.  Ask your health care provider for help if you need support or information about quitting drugs. Alcohol use  Do not drink alcohol if your health care provider tells you not to drink.  If you drink alcohol: ? Limit how much you have to 0-2 drinks a day. ? Be aware of how much alcohol is in your drink. In the [place] one drink equals one 12 oz bottle of beer (355 mL), one 5 oz glass of wine (148 mL), or one 1 oz glass of hard liquor (44 mL). General instructions  Schedule regular health, dental, and eye exams.  Stay current with your vaccines.  Tell your health care provider if: ? You often feel depressed. ? You have ever been abused or do not feel safe at home. Summary  Adopting a healthy lifestyle and getting preventive care are important in promoting health and wellness.  Follow your health care provider's instructions about healthy diet, exercising, and getting tested or screened for diseases.  Follow your health care provider's instructions on monitoring your cholesterol and blood pressure. This information is not intended to replace advice given to you by your health care provider. Make sure you discuss any questions you have with your health care provider. Document Revised: 09/15/2018 Document Reviewed: 09/15/2018 Elsevier Patient Education  2021 [place] Maintenance Summary and Written Plan of Care  Joel Hopkins ,  Thank you for allowing me to perform your Medicare Annual Wellness Visit and for your ongoing commitment to your health.   Health Maintenance & Immunization History Health Maintenance  Topic Date Due  . COLONOSCOPY (Pts 45-54yrs Insurance coverage will need to be confirmed)  08/12/2023  . TETANUS/TDAP  05/04/2028  . INFLUENZA VACCINE  Completed  . COVID-19 Vaccine  Completed  . Hepatitis C Screening  Completed  . PNA vac Low Risk Adult  Completed   Immunization History  Administered Date(s) Administered  . Influenza, High Dose Seasonal PF 08/04/2019  . Influenza,inj,Quad PF,6+ Mos 06/30/2018  . Influenza-Unspecified 06/15/2014, 08/07/2019, 07/20/2020  . Janssen (J&J) SARS-COV-2 Vaccination 12/18/2019, 09/07/2020  . Pneumococcal Conjugate-13 10/15/2015  . Pneumococcal Polysaccharide-23 04/09/2017  . Tdap 04/09/2017, 05/04/2018  . Zoster 01/30/2016  . Zoster Recombinat (Shingrix) 06/30/2018, 08/30/2018    These are the patient goals that we discussed: Goals Addressed  This Visit's Progress   .  Patient Stated (pt-stated)        10/29/2020 AWV Goal: Exercise for General Health   Patient will verbalize understanding of the benefits of increased physical activity:  Exercising regularly is important. It will improve your overall fitness, flexibility, and endurance.  Regular exercise also will improve your overall health. It can help you control your weight, reduce stress, and improve your bone density.  Over the next year, patient will increase physical activity as tolerated with a goal of at least 150 minutes of moderate physical activity per week.   You can tell that you are exercising at a moderate intensity if your heart starts beating faster and you start breathing faster but can still hold a conversation.  Moderate-intensity exercise ideas include:  Walking 1 mile (1.6 km) in  about 15 minutes  Biking  Hiking  Golfing  Dancing  Water aerobics  Patient will verbalize understanding of everyday activities that increase physical activity by providing examples like the following: ? Yard work, such as: ? Pushing a Conservation officer, nature ? Raking and bagging leaves ? Washing your car ? Pushing a stroller ? Shoveling snow ? Gardening ? Washing windows or floors  Patient will be able to explain general safety guidelines for exercising:   Before you start a new exercise program, talk with your health care provider.  Do not exercise so much that you hurt yourself, feel dizzy, or get very short of breath.  Wear comfortable clothes and wear shoes with good support.  Drink plenty of water while you exercise to prevent dehydration or heat stroke.  Work out until your breathing and your heartbeat get faster.         This is a list of Health Maintenance Items that are overdue or due now: There are no preventive care reminders to display for this patient.   Orders/Referrals Placed Today: No orders of the defined types were placed in this encounter.   Follow-up Plan Follow-up with Luetta Nutting, DO as planned

## 2020-10-29 NOTE — Progress Notes (Signed)
MEDICARE ANNUAL WELLNESS VISIT  10/29/2020  Telephone Visit Disclaimer This Medicare AWV was conducted by telephone due to national recommendations for restrictions regarding the COVID-19 Pandemic (e.g. social distancing).  I verified, using two identifiers, that I am speaking with Joel Hopkins or their authorized healthcare agent. I discussed the limitations, risks, security, and privacy concerns of performing an evaluation and management service by telephone and the potential availability of an in-person appointment in the future. The patient expressed understanding and agreed to proceed.  Location of Patient: Home with his wife Location of Provider (nurse):  In the office.  Subjective:    Joel Hopkins is a 75 y.o. male patient of Luetta Nutting, DO who had a Medicare Annual Wellness Visit today via telephone. Joel Hopkins is Retired and lives with their spouse. he has 3 children. he reports that he is socially active and does interact with friends/family regularly. he is minimally physically active and enjoys working on old cars.  Patient Care Team: Luetta Nutting, DO as PCP - General (Family Medicine)  Advanced Directives 10/29/2020 10/24/2019 10/18/2018 06/10/2017 03/06/2017  Does Patient Have a Medical Advance Directive? Yes Yes No Yes No  Type of Paramedic of [place];Living will [place];Living will - [place];Living will -  Does patient want to make changes to medical advance directive? No - Patient declined No - Patient declined - - -  Copy of [place] in Chart? No - copy requested No - copy requested - No - copy requested -  Would patient like information on creating a medical advance directive? No - Patient declined - Yes (MAU/Ambulatory/Procedural Areas - Information given) - -    Hospital Utilization Over the Past 12 Months: # of hospitalizations or ER visits: 0 # of surgeries: 0  Review of  Systems    Patient reports that his overall health is unchanged compared to last year.  History obtained from chart review and the patient  Patient Reported Readings (BP, Pulse, CBG, Weight, etc) none  Pain Assessment Pain : No/denies pain     Current Medications & Allergies (verified) Allergies as of 10/29/2020   Not on File     Medication List       Accurate as of October 29, 2020  4:24 PM. If you have any questions, ask your nurse or doctor.        azithromycin 250 MG tablet Commonly known as: Zithromax Z-Pak Take 2 tablets (500 mg) on  Day 1,  followed by 1 tablet (250 mg) once daily on Days 2 through 5.   cetirizine 10 MG tablet Commonly known as: ZYRTEC Take 10 mg by mouth daily.   levothyroxine 88 MCG tablet Commonly known as: SYNTHROID TAKE ONE TABLET BY MOUTH DAILY BEFORE BREAKFAST       History (reviewed): Past Medical History:  Diagnosis Date  . Hypothyroid 09/21/2012   Past Surgical History:  Procedure Laterality Date  . APPENDECTOMY    . BACK SURGERY    . CATARACT EXTRACTION    . HERNIA REPAIR    . VASECTOMY     Family History  Problem Relation Age of Onset  . Colon cancer Sister   . Diabetes Brother   . Heart disease Mother   . Cancer Brother        brain cancer  . Heart disease Brother   . COPD Brother   . Cancer Brother        lung  cancer   Social History   Socioeconomic History  . Marital status: Married    Spouse name: [name]  . Number of children: 3  . Years of education: 81  . Highest education level: 12th grade  Occupational History  . Occupation: retired    Comment: truck Geophysicist/field seismologist  Tobacco Use  . Smoking status: Never Smoker  . Smokeless tobacco: Never Used  Vaping Use  . Vaping Use: Never used  Substance and Sexual Activity  . Alcohol use: Not Currently  . Drug use: No  . Sexual activity: Yes  Other Topics Concern  . 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 Drinks coffee daily. Stays active in his work around the house   Social Determinants of Health   Financial Resource Strain: [place]   . Difficulty of Paying Living Expenses: Not hard at all  Food Insecurity: No Food Insecurity  . Worried About Charity fundraiser in the Last Year: Never true  . Ran Out of Food in the Last Year: Never true  Transportation Needs: No Transportation Needs  . Lack of Transportation (Medical): No  . Lack of Transportation (Non-Medical): No  Physical Activity: Inactive  . Days of Exercise per Week: 0 days  . Minutes of Exercise per Session: 0 min  Stress: No Stress Concern Present  . Feeling of Stress : Not at all  Social Connections: Moderately Isolated  . Frequency of Communication with Friends and Family: More than three times a week  . Frequency of Social Gatherings with Friends and Family: More than three times a week  . Attends Religious Services: Never  . Active Member of Clubs or Organizations: No  . Attends Archivist Meetings: Never  . Marital Status: Married    Activities of Daily Living In your present state of health, do you have any difficulty performing the following activities: 10/29/2020  Hearing? Y  Comment has hearing aids/ uses it somtimes  Vision? N  Difficulty concentrating or making decisions? N  Walking or climbing stairs? N  Dressing or bathing? N  Doing errands, shopping? N  Preparing Food and eating ? N  Using the Toilet? N  In the past six months, have you accidently leaked urine? N  Do you have problems with loss of bowel control? N  Managing your Medications? N  Managing your Finances? N  Housekeeping or managing your Housekeeping? N  Some recent data might be hidden    Patient Education/ Literacy How often do you need to have someone help you when you read instructions, pamphlets, or other written materials from your doctor or  pharmacy?: 1 - Never What is the last grade level you completed in school?: High School Diploma  Exercise Current Exercise Habits: The patient does not participate in regular exercise at present, Exercise limited by: None identified  Diet Patient reports consuming 3 meals a day and 2 snack(s) a day Patient reports that his primary diet is: Regular Patient reports that she does have regular access to food.   Depression Screen PHQ 2/9 Scores 10/29/2020 10/24/2019 10/18/2018 01/25/2018 06/10/2017 12/22/2016 06/19/2016  PHQ - 2 Score 0 0 0 0 0 0 3  PHQ- 9 Score 0 - - 0 - - 12     Fall Risk Fall Risk  10/29/2020 10/24/2019 10/18/2018 06/30/2017 06/19/2016  Falls in the past year?  1 0 0 No No  Number falls in past yr: 0 - - - -  Injury with Fall? 0 0 0 - -  Risk for fall due to : No Fall Risks No Fall Risks - - -  Follow up Falls evaluation completed;Education provided Falls prevention discussed Falls prevention discussed - -     Objective:  Joel Hopkins seemed alert and oriented and he participated appropriately during our telephone visit.  Blood Pressure Weight BMI  BP Readings from Last 3 Encounters:  10/26/20 139/78  11/09/19 115/72  10/24/19 105/61   Wt Readings from Last 3 Encounters:  10/26/20 204 lb 8 oz (92.8 kg)  11/09/19 198 lb (89.8 kg)  10/24/19 200 lb (90.7 kg)   BMI Readings from Last 1 Encounters:  10/26/20 32.03 kg/m    *Unable to obtain current vital signs, weight, and BMI due to telephone visit type  Hearing/Vision  . [place] did not seem to have difficulty with hearing/understanding during the telephone conversation . Reports that he has had a formal eye exam by an eye care professional within the past year . Reports that he has had a formal hearing evaluation within the past year *Unable to fully assess hearing and vision during telephone visit type  Cognitive Function: 6CIT Screen 10/29/2020 10/24/2019 10/18/2018  What Year? 0 points 0 points 0 points  What  month? 0 points 0 points 0 points  What time? 0 points 0 points 0 points  Count back from 20 0 points 0 points 0 points  Months in reverse 2 points 0 points 0 points  Repeat phrase 0 points 0 points 0 points  Total Score 2 0 0   (Normal:0-7, Significant for Dysfunction: >8)  Normal Cognitive Function Screening: Yes   Immunization & Health Maintenance Record Immunization History  Administered Date(s) Administered  . Influenza, High Dose Seasonal PF 08/04/2019  . Influenza,inj,Quad PF,6+ Mos 06/30/2018  . Influenza-Unspecified 06/15/2014, 08/07/2019, 07/20/2020  . Janssen (J&J) SARS-COV-2 Vaccination 12/18/2019, 09/07/2020  . Pneumococcal Conjugate-13 10/15/2015  . Pneumococcal Polysaccharide-23 04/09/2017  . Tdap 04/09/2017, 05/04/2018  . Zoster 01/30/2016  . Zoster Recombinat (Shingrix) 06/30/2018, 08/30/2018    Health Maintenance  Topic Date Due  . COLONOSCOPY (Pts 45-40yrs Insurance coverage will need to be confirmed)  08/12/2023  . TETANUS/TDAP  05/04/2028  . INFLUENZA VACCINE  Completed  . COVID-19 Vaccine  Completed  . Hepatitis C Screening  Completed  . PNA vac Low Risk Adult  Completed       Assessment  This is a routine wellness examination for IBIN CIPRES.  Health Maintenance: Due or Overdue There are no preventive care reminders to display for this patient.  Joel Hopkins does not need a referral for Community Assistance: Care Management:   no Social Work:    no Prescription Assistance:  no Nutrition/Diabetes Education:  no   Plan:  Personalized Goals Goals Addressed              This Visit's Progress   .  Patient Stated (pt-stated)        10/29/2020 AWV Goal: Exercise for General Health   Patient will verbalize understanding of the benefits of increased physical activity:  Exercising regularly is important. It will improve your overall fitness, flexibility, and endurance.  Regular exercise also will improve your overall health. It  can help you control your weight, reduce stress, and improve your bone density.  Over the next year, patient will increase physical activity as tolerated with a  goal of at least 150 minutes of moderate physical activity per week.   You can tell that you are exercising at a moderate intensity if your heart starts beating faster and you start breathing faster but can still hold a conversation.  Moderate-intensity exercise ideas include:  Walking 1 mile (1.6 km) in about 15 minutes  Biking  Hiking  Golfing  Dancing  Water aerobics  Patient will verbalize understanding of everyday activities that increase physical activity by providing examples like the following: ? Yard work, such as: ? Pushing a Conservation officer, nature ? Raking and bagging leaves ? Washing your car ? Pushing a stroller ? Shoveling snow ? Gardening ? Washing windows or floors  Patient will be able to explain general safety guidelines for exercising:   Before you start a new exercise program, talk with your health care provider.  Do not exercise so much that you hurt yourself, feel dizzy, or get very short of breath.  Wear comfortable clothes and wear shoes with good support.  Drink plenty of water while you exercise to prevent dehydration or heat stroke.  Work out until your breathing and your heartbeat get faster.       Personalized Health Maintenance & Screening Recommendations  None  Lung Cancer Screening Recommended: no (Low Dose CT Chest recommended if Age 33-80 years, 30 pack-year currently smoking OR have quit w/in past 15 years) Hepatitis C Screening recommended: no HIV Screening recommended: yes  Advanced Directives: Written information was not prepared per patient's request.  Referrals & Orders No orders of the defined types were placed in this encounter.   Follow-up Plan . Follow-up with Luetta Nutting, DO as planned   I have personally reviewed and noted the following in the patient's chart:    . Medical and social history . Use of alcohol, tobacco or illicit drugs  . Current medications and supplements . Functional ability and status . Nutritional status . Physical activity . Advanced directives . List of other physicians . Hospitalizations, surgeries, and ER visits in previous 12 months . Vitals . Screenings to include cognitive, depression, and falls . Referrals and appointments  In addition, I have reviewed and discussed with Joel Hopkins certain preventive protocols, quality metrics, and best practice recommendations. A written personalized care plan for preventive services as well as general preventive health recommendations is available and can be mailed to the patient at his request.      Tinnie Gens, RN  10/29/2020

## 2020-10-29 NOTE — Telephone Encounter (Signed)
Lab orders entered

## 2020-10-31 ENCOUNTER — Encounter: Payer: Self-pay | Admitting: Physician Assistant

## 2020-10-31 LAB — NOVEL CORONAVIRUS, NAA: SARS-CoV-2, NAA: DETECTED — AB

## 2020-10-31 NOTE — Progress Notes (Signed)
Pt is positive for covid. How is he feeling today?

## 2020-11-01 ENCOUNTER — Other Ambulatory Visit: Payer: Self-pay

## 2020-11-01 ENCOUNTER — Other Ambulatory Visit: Payer: Self-pay | Admitting: Family Medicine

## 2021-01-26 ENCOUNTER — Other Ambulatory Visit: Payer: Self-pay | Admitting: Family Medicine

## 2021-03-18 ENCOUNTER — Encounter: Payer: Medicare HMO | Admitting: Sports Medicine

## 2021-04-05 ENCOUNTER — Ambulatory Visit (INDEPENDENT_AMBULATORY_CARE_PROVIDER_SITE_OTHER): Payer: Medicare HMO | Admitting: Family Medicine

## 2021-04-05 ENCOUNTER — Encounter: Payer: Self-pay | Admitting: Family Medicine

## 2021-04-05 ENCOUNTER — Other Ambulatory Visit: Payer: Self-pay

## 2021-04-05 ENCOUNTER — Ambulatory Visit (INDEPENDENT_AMBULATORY_CARE_PROVIDER_SITE_OTHER): Payer: Medicare HMO

## 2021-04-05 VITALS — BP 103/67 | HR 74 | Temp 98.2°F | Resp 20 | Ht 67.0 in | Wt 207.0 lb

## 2021-04-05 DIAGNOSIS — M25521 Pain in right elbow: Secondary | ICD-10-CM | POA: Diagnosis not present

## 2021-04-05 MED ORDER — PREDNISONE 10 MG (48) PO TBPK: ORAL_TABLET | ORAL | 0 refills | Status: DC

## 2021-04-07 DIAGNOSIS — M25521 Pain in right elbow: Secondary | ICD-10-CM | POA: Insufficient documentation

## 2021-04-07 NOTE — Progress Notes (Signed)
Joel Hopkins - 75 y.o. male MRN [id number]  Date of birth: Dec 24, 1945  Subjective Chief Complaint  Patient presents with   Arm Pain    HPI Joel Hopkins is a 75 year old male here today with complaint of right elbow and shoulder pain.  Pain is mostly located in the right elbow area which radiates up into the upper arm.  He does not recall any injury to this area.  Pain is worse with movement at the elbow.  He does have some tenderness to palpation as well.  He has not noted any numbness or tingling.  He has not really tried anything to help with this at home.  ROS:  A comprehensive ROS was completed and negative except as noted per HPI  No Known Allergies  Past Medical History:  Diagnosis Date   Hypothyroid 09/21/2012    Past Surgical History:  Procedure Laterality Date   APPENDECTOMY     BACK SURGERY     CATARACT EXTRACTION     HERNIA REPAIR     VASECTOMY      Social History   Socioeconomic History   Marital status: Married    Spouse name: [name]   Number of children: 3   Years of education: 12   Highest education level: 12th grade  Occupational History   Occupation: retired    Comment: truck Geophysicist/field seismologist  Tobacco Use   Smoking status: Never   Smokeless tobacco: Never  Scientific laboratory technician Use: Never used  Substance and Sexual Activity   Alcohol use: Not Currently   Drug use: No   Sexual activity: Yes  Other Topics Concern   Not on file  Social History Narrative   ** Merged History Encounter **       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 Drinks coffee daily. Stays active in his work around the house   Social Determinants of Radio broadcast assistant Strain: Low Risk    Difficulty of Paying Living Expenses: Not hard at all  Food Insecurity: No Food Insecurity   Worried About Charity fundraiser in the Last Year: Never true   Arboriculturist in the Last Year: Never true  Transportation Needs: No Transportation Needs    Lack of Transportation (Medical): No   Lack of Transportation (Non-Medical): No  Physical Activity: Inactive   Days of Exercise per Week: 0 days   Minutes of Exercise per Session: 0 min  Stress: No Stress Concern Present   Feeling of Stress : Not at all  Social Connections: Moderately Isolated   Frequency of Communication with Friends and Family: More than three times a week   Frequency of Social Gatherings with Friends and Family: More than three times a week   Attends Religious Services: Never   Marine scientist or Organizations: No   Attends Archivist Meetings: Never   Marital Status: Married    Family History  Problem Relation Age of Onset   Colon cancer Sister    Diabetes Brother    Heart disease Mother    Cancer Brother        brain cancer   Heart disease Brother    COPD Brother    Cancer Brother        lung cancer    Health Maintenance  Topic Date Due   COVID-19 Vaccine (3 - Booster for Janssen series) 01/06/2021   INFLUENZA VACCINE  05/06/2021   COLONOSCOPY (Pts 45-80yrs  Insurance coverage will need to be confirmed)  08/12/2023   TETANUS/TDAP  05/04/2028   Hepatitis C Screening  Completed   PNA vac Low Risk Adult  Completed   Zoster Vaccines- Shingrix  Completed   HPV VACCINES  Aged Out     ----------------------------------------------------------------------------------------------------------------------------------------------------------------------------------------------------------------- Physical Exam BP 103/67 (BP Location: Left Arm, Patient Position: Sitting, Cuff Size: Large)   Pulse 74   Temp 98.2 F (36.8 C) (Oral)   Resp 20   Ht 5\' 7"  (1.702 m)   Wt 207 lb (93.9 kg)   SpO2 97%   BMI 32.42 kg/m   Physical Exam Constitutional:      Appearance: Normal appearance.  HENT:     Head: Normocephalic and atraumatic.  Musculoskeletal:     Cervical back: Neck supple.     Comments: Right elbow normal to inspection without  swelling or effusion.  Right shoulder is normal without tenderness.  He does have tenderness to palpation along the posterior olecranon and lateral epicondyle.  Resisted wrist extension does not seem to worsen his symptoms.  He does have pain with extension and flexion of the elbow.  Neurological:     Mental Status: He is alert.    ------------------------------------------------------------------------------------------------------------------------------------------------------------------------------------------------------------------- Assessment and Plan  Right elbow pain Right elbow pain possibly due to arthritis.  X-rays of the right elbow has been ordered.  I am going to try him on a steroid taper to see if this improves his symptoms.  Differential also includes gouty arthritis, steroids will be helpful for this as well.  We discussed having him follow-up with Dr. Dianah Field if his symptoms are not significantly improving with this.   Meds ordered this encounter  Medications   predniSONE (STERAPRED UNI-PAK 48 TAB) 10 MG (48) TBPK tablet    Sig: Taper as directed on packaging    Dispense:  48 tablet    Refill:  0    No follow-ups on file.    This visit occurred during the SARS-CoV-2 public health emergency.  Safety protocols were in place, including screening questions prior to the visit, additional usage of staff PPE, and extensive cleaning of exam room while observing appropriate contact time as indicated for disinfecting solutions.

## 2021-04-07 NOTE — Assessment & Plan Note (Signed)
Right elbow pain possibly due to arthritis.  X-rays of the right elbow has been ordered.  I am going to try him on a steroid taper to see if this improves his symptoms.  Differential also includes gouty arthritis, steroids will be helpful for this as well.  We discussed having him follow-up with Dr. Dianah Field if his symptoms are not significantly improving with this.

## 2021-04-09 ENCOUNTER — Other Ambulatory Visit: Payer: Self-pay

## 2021-04-09 ENCOUNTER — Other Ambulatory Visit: Payer: Medicare HMO

## 2021-04-09 DIAGNOSIS — N1831 Chronic kidney disease, stage 3a: Secondary | ICD-10-CM | POA: Diagnosis not present

## 2021-04-09 DIAGNOSIS — E785 Hyperlipidemia, unspecified: Secondary | ICD-10-CM | POA: Diagnosis not present

## 2021-04-09 DIAGNOSIS — E039 Hypothyroidism, unspecified: Secondary | ICD-10-CM | POA: Diagnosis not present

## 2021-04-09 DIAGNOSIS — R972 Elevated prostate specific antigen [PSA]: Secondary | ICD-10-CM | POA: Diagnosis not present

## 2021-04-10 LAB — CBC
HCT: 39.8 % (ref 38.5–50.0)
Hemoglobin: 13.4 g/dL (ref 13.2–17.1)
MCH: 31 pg (ref 27.0–33.0)
MCHC: 33.7 g/dL (ref 32.0–36.0)
MCV: 92.1 fL (ref 80.0–100.0)
MPV: 11.8 fL (ref 7.5–12.5)
Platelets: 127 10*3/uL — ABNORMAL LOW (ref 140–400)
RBC: 4.32 10*6/uL (ref 4.20–5.80)
RDW: 12.8 % (ref 11.0–15.0)
WBC: 12.7 10*3/uL — ABNORMAL HIGH (ref 3.8–10.8)

## 2021-04-10 LAB — COMPLETE METABOLIC PANEL WITH GFR
AG Ratio: 1.7 (calc) (ref 1.0–2.5)
ALT: 16 U/L (ref 9–46)
AST: 13 U/L (ref 10–35)
Albumin: 4 g/dL (ref 3.6–5.1)
Alkaline phosphatase (APISO): 56 U/L (ref 35–144)
BUN/Creatinine Ratio: 20 (calc) (ref 6–22)
BUN: 27 mg/dL — ABNORMAL HIGH (ref 7–25)
CO2: 25 mmol/L (ref 20–32)
Calcium: 9.3 mg/dL (ref 8.6–10.3)
Chloride: 105 mmol/L (ref 98–110)
Creat: 1.37 mg/dL — ABNORMAL HIGH (ref 0.70–1.18)
GFR, Est African American: 59 mL/min/{1.73_m2} — ABNORMAL LOW (ref >=60)
GFR, Est Non African American: 51 mL/min/{1.73_m2} — ABNORMAL LOW (ref >=60)
Globulin: 2.4 g/dL (calc) (ref 1.9–3.7)
Glucose, Bld: 111 mg/dL — ABNORMAL HIGH (ref 65–99)
Potassium: 4.5 mmol/L (ref 3.5–5.3)
Sodium: 139 mmol/L (ref 135–146)
Total Bilirubin: 0.4 mg/dL (ref 0.2–1.2)
Total Protein: 6.4 g/dL (ref 6.1–8.1)

## 2021-04-10 LAB — LIPID PANEL
Cholesterol: 205 mg/dL — ABNORMAL HIGH (ref <200)
HDL: 56 mg/dL (ref >=40)
LDL Cholesterol (Calc): 134 mg/dL (calc) — ABNORMAL HIGH
Non-HDL Cholesterol (Calc): 149 mg/dL (calc) — ABNORMAL HIGH (ref <130)
Total CHOL/HDL Ratio: 3.7 (calc) (ref <5.0)
Triglycerides: 62 mg/dL (ref <150)

## 2021-04-10 LAB — TSH: TSH: 0.65 mIU/L (ref 0.40–4.50)

## 2021-04-10 LAB — PSA: PSA: 7.79 ng/mL — ABNORMAL HIGH (ref <=4.00)

## 2021-04-12 ENCOUNTER — Other Ambulatory Visit: Payer: Self-pay | Admitting: Family Medicine

## 2021-04-12 DIAGNOSIS — R972 Elevated prostate specific antigen [PSA]: Secondary | ICD-10-CM

## 2021-04-12 DIAGNOSIS — D72829 Elevated white blood cell count, unspecified: Secondary | ICD-10-CM

## 2021-06-04 DIAGNOSIS — L821 Other seborrheic keratosis: Secondary | ICD-10-CM | POA: Diagnosis not present

## 2021-06-04 DIAGNOSIS — Z85820 Personal history of malignant melanoma of skin: Secondary | ICD-10-CM | POA: Diagnosis not present

## 2021-06-04 DIAGNOSIS — D485 Neoplasm of uncertain behavior of skin: Secondary | ICD-10-CM | POA: Diagnosis not present

## 2021-06-04 DIAGNOSIS — L905 Scar conditions and fibrosis of skin: Secondary | ICD-10-CM | POA: Diagnosis not present

## 2021-10-29 DIAGNOSIS — M722 Plantar fascial fibromatosis: Secondary | ICD-10-CM | POA: Diagnosis not present

## 2021-10-30 ENCOUNTER — Other Ambulatory Visit: Payer: Self-pay | Admitting: Family Medicine

## 2021-11-04 ENCOUNTER — Other Ambulatory Visit: Payer: Self-pay

## 2021-11-04 ENCOUNTER — Encounter: Payer: Self-pay | Admitting: Family Medicine

## 2021-11-04 ENCOUNTER — Ambulatory Visit (INDEPENDENT_AMBULATORY_CARE_PROVIDER_SITE_OTHER): Payer: Medicare HMO | Admitting: Family Medicine

## 2021-11-04 DIAGNOSIS — B9789 Other viral agents as the cause of diseases classified elsewhere: Secondary | ICD-10-CM | POA: Diagnosis not present

## 2021-11-04 DIAGNOSIS — J329 Chronic sinusitis, unspecified: Secondary | ICD-10-CM | POA: Diagnosis not present

## 2021-11-04 DIAGNOSIS — J40 Bronchitis, not specified as acute or chronic: Secondary | ICD-10-CM | POA: Insufficient documentation

## 2021-11-04 MED ORDER — PREDNISONE 10 MG (21) PO TBPK: ORAL_TABLET | ORAL | 0 refills | Status: DC

## 2021-11-04 NOTE — Assessment & Plan Note (Signed)
Upper respiratory infection with viral sinusitis.  Adding prednisone taper.  Recommend over-the-counter medications for management of symptoms.  Additional fluids and rest recommended.

## 2021-11-04 NOTE — Progress Notes (Signed)
Joel Hopkins - 76 y.o. male MRN [id number]  Date of birth: March 31, 1946  Subjective Chief Complaint  Patient presents with   URI    HPI Joel Hopkins is a 76 year old male here today with complaint of rhinorrhea with nasal congestion, mild cough, fatigue, body aches.  Symptoms started approximately 3 to 4 days ago.  He denies difficulty breathing, wheezing, fever or chills.  He has not taken anything for management of symptoms.  He did have a negative COVID test today.  ROS:  A comprehensive ROS was completed and negative except as noted per HPI  No Known Allergies  Past Medical History:  Diagnosis Date   Hypothyroid 09/21/2012    Past Surgical History:  Procedure Laterality Date   APPENDECTOMY     BACK SURGERY     CATARACT EXTRACTION     HERNIA REPAIR     VASECTOMY      Social History   Socioeconomic History   Marital status: Married    Spouse name: [name]   Number of children: 3   Years of education: 12   Highest education level: 12th grade  Occupational History   Occupation: retired    Comment: truck Geophysicist/field seismologist  Tobacco Use   Smoking status: Never   Smokeless tobacco: Never  Scientific laboratory technician Use: Never used  Substance and Sexual Activity   Alcohol use: Not Currently   Drug use: No   Sexual activity: Yes  Other Topics Concern   Not on file  Social History Narrative   ** Merged History Encounter **       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 Drinks coffee daily. Stays active in his work around the house   Social Determinants of Radio broadcast assistant Strain: Not on file  Food Insecurity: Not on file  Transportation Needs: Not on file  Physical Activity: Not on file  Stress: Not on file  Social Connections: Not on file    Family History  Problem Relation Age of Onset   Colon cancer Sister    Diabetes Brother    Heart disease Mother    Cancer Brother        brain cancer   Heart disease Brother    COPD  Brother    Cancer Brother        lung cancer    Health Maintenance  Topic Date Due   COVID-19 Vaccine (3 - Booster for Janssen series) 11/02/2020   INFLUENZA VACCINE  05/06/2021   COLONOSCOPY (Pts 45-39yrs Insurance coverage will need to be confirmed)  08/12/2023   TETANUS/TDAP  05/04/2028   Pneumonia Vaccine 12+ Years old  Completed   Hepatitis C Screening  Completed   Zoster Vaccines- Shingrix  Completed   HPV VACCINES  Aged Out     ----------------------------------------------------------------------------------------------------------------------------------------------------------------------------------------------------------------- Physical Exam BP 109/66 (BP Location: Left Arm, Patient Position: Sitting, Cuff Size: Normal)    Pulse 85    Temp 97.8 F (36.6 C) (Oral)    Ht 6\' 1"  (1.854 m)    Wt 214 lb (97.1 kg)    SpO2 97%    BMI 28.23 kg/m   Physical Exam Constitutional:      Appearance: Normal appearance.  HENT:     Right Ear: Tympanic membrane normal.     Left Ear: Tympanic membrane normal.  Eyes:     General: No scleral icterus. Cardiovascular:     Rate and Rhythm: Normal rate and regular rhythm.  Pulmonary:  Effort: Pulmonary effort is normal.     Breath sounds: Normal breath sounds.  Musculoskeletal:     Cervical back: Neck supple.  Neurological:     Mental Status: He is alert.  Psychiatric:        Mood and Affect: Mood normal.        Behavior: Behavior normal.    ------------------------------------------------------------------------------------------------------------------------------------------------------------------------------------------------------------------- Assessment and Plan  Viral sinusitis Upper respiratory infection with viral sinusitis.  Adding prednisone taper.  Recommend over-the-counter medications for management of symptoms.  Additional fluids and rest recommended.   Meds ordered this encounter  Medications   predniSONE  (STERAPRED UNI-PAK 21 TAB) 10 MG (21) TBPK tablet    Sig: Taper as directed on packaging.    Dispense:  21 tablet    Refill:  0    No follow-ups on file.    This visit occurred during the SARS-CoV-2 public health emergency.  Safety protocols were in place, including screening questions prior to the visit, additional usage of staff PPE, and extensive cleaning of exam room while observing appropriate contact time as indicated for disinfecting solutions.

## 2021-11-04 NOTE — Patient Instructions (Signed)
Start prednisone taper.  Increase fluid intake.  You can add mucinex for chest congestion.  Delsym for cough. Afrin for no longer than 3-4 days for nasal congestion.  Call if not improving after 7-10 days.

## 2021-11-08 ENCOUNTER — Telehealth: Payer: Self-pay

## 2021-11-08 ENCOUNTER — Encounter: Payer: Self-pay | Admitting: Family Medicine

## 2021-11-08 NOTE — Telephone Encounter (Signed)
Spouse states he's not improved. Has 1 day of prednisone left.  Coughing up mucus and junk. She would like to know if you could call in something to help him over the weekend. His appt is Monday.

## 2021-11-11 ENCOUNTER — Ambulatory Visit (INDEPENDENT_AMBULATORY_CARE_PROVIDER_SITE_OTHER): Payer: Medicare HMO | Admitting: Family Medicine

## 2021-11-11 ENCOUNTER — Other Ambulatory Visit: Payer: Self-pay

## 2021-11-11 ENCOUNTER — Encounter: Payer: Self-pay | Admitting: Family Medicine

## 2021-11-11 DIAGNOSIS — J329 Chronic sinusitis, unspecified: Secondary | ICD-10-CM

## 2021-11-11 DIAGNOSIS — J40 Bronchitis, not specified as acute or chronic: Secondary | ICD-10-CM | POA: Diagnosis not present

## 2021-11-11 MED ORDER — AMOXICILLIN-POT CLAVULANATE 875-125 MG PO TABS: 1.0000 | ORAL_TABLET | Freq: Two times a day (BID) | ORAL | 0 refills | Status: DC

## 2021-11-11 NOTE — Progress Notes (Signed)
Joel Hopkins - 76 y.o. male MRN [id number]  Date of birth: 1945/12/08  Subjective Chief Complaint  Patient presents with   Cough   Foot Injury    HPI Joel Hopkins is a 76 year old male here today for follow-up of respiratory infection.  Was seen last week for symptoms of upper respiratory infection.  Prednisone added however he has had continued progression of symptoms since that time with increased chest congestion and sinus pressure.  Mucus  has evolved to a thick, green/yellow color.  He denies fever, chills, headaches, shortness of breath.  ROS:  A comprehensive ROS was completed and negative except as noted per HPI  No Known Allergies  Past Medical History:  Diagnosis Date   Hypothyroid 09/21/2012    Past Surgical History:  Procedure Laterality Date   APPENDECTOMY     BACK SURGERY     CATARACT EXTRACTION     HERNIA REPAIR     VASECTOMY      Social History   Socioeconomic History   Marital status: Married    Spouse name: [name]   Number of children: 3   Years of education: 12   Highest education level: 12th grade  Occupational History   Occupation: retired    Comment: truck Geophysicist/field seismologist  Tobacco Use   Smoking status: Never   Smokeless tobacco: Never  Scientific laboratory technician Use: Never used  Substance and Sexual Activity   Alcohol use: Not Currently   Drug use: No   Sexual activity: Yes  Other Topics Concern   Not on file  Social History Narrative   ** Merged History Encounter **       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 Drinks coffee daily. Stays active in his work around the house   Social Determinants of Radio broadcast assistant Strain: Not on file  Food Insecurity: Not on file  Transportation Needs: Not on file  Physical Activity: Not on file  Stress: Not on file  Social Connections: Not on file    Family History  Problem Relation Age of Onset   Colon cancer Sister    Diabetes Brother    Heart disease  Mother    Cancer Brother        brain cancer   Heart disease Brother    COPD Brother    Cancer Brother        lung cancer    Health Maintenance  Topic Date Due   COVID-19 Vaccine (3 - Booster for [employer] series) 11/02/2020   COLONOSCOPY (Pts 45-84yrs Insurance coverage will need to be confirmed)  08/12/2023   TETANUS/TDAP  05/04/2028   Pneumonia Vaccine 99+ Years old  Completed   INFLUENZA VACCINE  Completed   Hepatitis C Screening  Completed   Zoster Vaccines- Shingrix  Completed   HPV VACCINES  Aged Out     ----------------------------------------------------------------------------------------------------------------------------------------------------------------------------------------------------------------- Physical Exam BP 125/71 (BP Location: Left Arm, Patient Position: Sitting, Cuff Size: Normal)    Pulse 70    Ht 6\' 1"  (1.854 m)    Wt 209 lb (94.8 kg)    SpO2 96%    BMI 27.57 kg/m   Physical Exam Constitutional:      Appearance: Normal appearance.  Cardiovascular:     Rate and Rhythm: Normal rate and regular rhythm.  Pulmonary:     Effort: Pulmonary effort is normal.     Breath sounds: Normal breath sounds.     Comments: Congested-sounding improving. Musculoskeletal:  Cervical back: Neck supple.  Neurological:     General: No focal deficit present.     Mental Status: He is alert.  Psychiatric:        Mood and Affect: Mood normal.        Behavior: Behavior normal.    ------------------------------------------------------------------------------------------------------------------------------------------------------------------------------------------------------------------- Assessment and Plan  Sinobronchitis Recommend increase fluid intake over-the-counter Mucinex.  Adding course of Augmentin.  Follow-up with symptoms continue to worsen.   Meds ordered this encounter  Medications   amoxicillin-clavulanate (AUGMENTIN) 875-125 MG tablet    Sig:  Take 1 tablet by mouth 2 (two) times daily.    Dispense:  20 tablet    Refill:  0    No follow-ups on file.    This visit occurred during the SARS-CoV-2 public health emergency.  Safety protocols were in place, including screening questions prior to the visit, additional usage of staff PPE, and extensive cleaning of exam room while observing appropriate contact time as indicated for disinfecting solutions.

## 2021-11-11 NOTE — Assessment & Plan Note (Signed)
Recommend increase fluid intake over-the-counter Mucinex.  Adding course of Augmentin.  Follow-up with symptoms continue to worsen.

## 2021-11-13 ENCOUNTER — Other Ambulatory Visit: Payer: Self-pay

## 2021-11-13 ENCOUNTER — Ambulatory Visit (INDEPENDENT_AMBULATORY_CARE_PROVIDER_SITE_OTHER): Payer: Medicare HMO | Admitting: Physician Assistant

## 2021-11-13 ENCOUNTER — Encounter: Payer: Self-pay | Admitting: Physician Assistant

## 2021-11-13 VITALS — BP 128/67 | HR 88 | Ht 73.0 in | Wt 209.0 lb

## 2021-11-13 DIAGNOSIS — T22311A Burn of third degree of right forearm, initial encounter: Secondary | ICD-10-CM

## 2021-11-13 MED ORDER — SILVER SULFADIAZINE 1 % EX CREA: 1.0000 "application " | TOPICAL_CREAM | Freq: Two times a day (BID) | CUTANEOUS | 0 refills | Status: DC

## 2021-11-13 NOTE — Progress Notes (Signed)
Patient ID: Joel Hopkins, male   DOB: Oct 13, 1945, 76 y.o.   MRN: [id number]
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
## 2021-11-13 NOTE — Patient Instructions (Signed)
Burn Care, Adult A burn is an injury to the skin or the tissues under the skin. There are three types of burns: First degree. These burns may cause the skin to be red and a bit swollen. Second degree. These burns are very painful and cause the skin to be very red. The skin may also swell, leak fluid, look shiny, and start to have blisters. Third degree. These burns cause lasting damage. They turn the skin white or black and make it look charred, dry, and leathery. Treatment for your burn will depend on the type of burn you have. Taking good care of your burn can help to prevent pain and infection. It can also help the burn heal quickly. How to care for a first-degree burn Right after a burn: Rinse or soak the burn under cool water for 5 minutes or more. Do not put ice on your burn. That can cause more damage. Put a cool, clean, wet cloth on your burn. Put lotion or gel with aloe vera on your burn. Caring for the burn Clean and care for your burn. Your doctor may tell you: To clean the burn using soap and water. To pat the burn dry using a clean cloth. Do not rub or scrub the burn. To put lotion or gel with aloe vera on your burn. How to care for a second-degree burn Right after a burn: Rinse or soak the burn under cool water. Do this for 5 to 10 minutes. Do not put ice on your burn. This can cause more damage. Remove any jewelry near the burned area. Cover the burn with a clean cloth. Caring for the burn Raise (elevate) the burned area above the level of your heart while sitting or lying down. Clean and care for your burn. Your doctor may tell you: To clean or rinse your burn. To put a cream or ointment on the burn. To place a germ-free (sterile) dressing over the burn. A dressing is a material that is placed on a burn to help it heal. How to care for a third-degree burn Right after a burn: Cover the burn with a clean, dry cloth. Seek treatment right away if you have this kind of burn.  You may: Need to stay in the hospital. Have surgery to remove burned tissue. Have surgery to put new skin on the burned area. Be given fluids through an IV tube. Caring for the burn Clean and care for your burn. Your doctor may tell you: To clean or rinse your burn. To put a cream or ointment on the burn. To put a sterile dressing in the burn. This is called packing. To place a sterile dressing over the burn. Other things to do Raise the burned area above the level of your heart while sitting or lying down. Wear splints or immobilizers if told by your doctor. Rest as told by your doctor. Do not do sports or other activities until your doctor approves. How to prevent infection when caring for a burn  Take these steps to prevent infection: Wash your hands with soap and water for at least 20 seconds before and after caring for your burn. If you cannot use soap and water, use hand sanitizer. Wear clean or sterile gloves as told by your doctor. Do not put butter, oil, toothpaste, or other home remedies on the burn. Do not scratch or pick at the burn. Do not break any blisters. Do not peel the skin. Do not rub your burn, even when  you are cleaning it. Check your burn every day for these signs of infection: More redness, swelling, or pain. Warmth. Pus or a bad smell. Red streaks around the burn. Follow these instructions at home Medicines Take over-the-counter and prescription medicines only as told by your doctor. If you were prescribed an antibiotic medicine, use it as told by your doctor. Do not stop using the antibiotic even if your condition gets better. Your doctor may ask you to take medicine for pain before you change your dressing. General instructions  Protect your burn from the sun. Drink enough fluid to keep your pee (urine) pale yellow. Do not use any products that contain nicotine or tobacco, such as cigarettes, e-cigarettes, and chewing tobacco. These can delay healing.  If you need help quitting, ask your doctor. Keep all follow-up visits as told by your doctor. This is important. Contact a doctor if: Your condition does not get better. Your condition gets worse. You have a fever or chills. Your burn feels warm to the touch. You have more redness, swelling, or pain on your burn. Your burn looks different or starts to have black or red spots on it. Your pain does not get better with medicine. Get help right away if: You have more fluid, blood, or pus coming from your burn. You have red streaks near the burn. You have very bad pain. Summary There are three types of burns. They are first degree, second degree, and third degree. Of these, a third-degree burn is most serious. This must be treated right away. Treatment for your burn will depend on the type of burn you have. Do not put butter, oil, toothpaste, or other home remedies on the burn. These things can damage your skin. Follow instructions from your doctor about how to clean and take care of your burn. This information is not intended to replace advice given to you by your health care provider. Make sure you discuss any questions you have with your health care provider. Document Revised: 11/11/2019 Document Reviewed: 07/12/2019 Elsevier Patient Education  [place].

## 2021-12-24 DIAGNOSIS — M722 Plantar fascial fibromatosis: Secondary | ICD-10-CM | POA: Diagnosis not present

## 2022-02-06 DIAGNOSIS — M722 Plantar fascial fibromatosis: Secondary | ICD-10-CM | POA: Diagnosis not present

## 2022-03-26 ENCOUNTER — Ambulatory Visit (INDEPENDENT_AMBULATORY_CARE_PROVIDER_SITE_OTHER): Payer: Medicare HMO | Admitting: Family Medicine

## 2022-03-26 DIAGNOSIS — Z0000 Encounter for general adult medical examination without abnormal findings: Secondary | ICD-10-CM

## 2022-03-26 NOTE — Progress Notes (Signed)
MEDICARE ANNUAL WELLNESS VISIT  03/26/2022  Telephone Visit Disclaimer This Medicare AWV was conducted by telephone due to national recommendations for restrictions regarding the COVID-19 Pandemic (e.g. social distancing).  I verified, using two identifiers, that I am speaking with Joel Hopkins or their authorized healthcare agent. I discussed the limitations, risks, security, and privacy concerns of performing an evaluation and management service by telephone and the potential availability of an in-person appointment in the future. The patient expressed understanding and agreed to proceed.  Location of Patient: Home with his wife who is also on the phone. Location of Provider (nurse):  In the office.  Subjective:    Joel Hopkins is a 76 y.o. male patient of Luetta Nutting, DO who had a Medicare Annual Wellness Visit today via telephone. Joel is Retired and lives with their spouse. he has 3 children. he reports that he is socially active and does interact with friends/family regularly. he is moderately physically active and enjoys yard work and working on old cars.  Patient Care Team: Luetta Nutting, DO as PCP - General (Family Medicine)     03/26/2022    8:09 AM 10/29/2020    4:07 PM 10/24/2019    9:08 AM 10/18/2018    9:15 AM 06/10/2017    1:39 PM 03/06/2017    4:01 PM  Advanced Directives  Does Patient Have a Medical Advance Directive? Yes Yes Yes No Yes No  Type of Advance Directive Living will [place];Living will [place];Living will  [place];Living will   Does patient want to make changes to medical advance directive? No - Patient declined No - Patient declined No - Patient declined     Copy of [place] in Chart?  No - copy requested No - copy requested  No - copy requested   Would patient like information on creating a medical advance directive?  No - Patient declined  Yes  (MAU/Ambulatory/Procedural Areas - Information given)      Hospital Utilization Over the Past 12 Months: # of hospitalizations or ER visits: 0 # of surgeries: 0  Review of Systems    Patient reports that his overall health is unchanged compared to last year.  History obtained from chart review and the patient  Patient Reported Readings (BP, Pulse, CBG, Weight, etc) none  Pain Assessment Pain : No/denies pain     Current Medications & Allergies (verified) Allergies as of 03/26/2022   No Known Allergies      Medication List        Accurate as of March 26, 2022  8:21 AM. If you have any questions, ask your nurse or doctor.          amoxicillin-clavulanate 875-125 MG tablet Commonly known as: AUGMENTIN Take 1 tablet by mouth 2 (two) times daily.   levothyroxine 88 MCG tablet Commonly known as: SYNTHROID TAKE ONE TABLET BY MOUTH EVERY MORNING BEFORE BREAKFAST   silver sulfADIAZINE 1 % cream Commonly known as: SILVADENE Apply 1 application topically 2 (two) times daily. Apply to deep burns        History (reviewed): Past Medical History:  Diagnosis Date   Hypothyroid 09/21/2012   Past Surgical History:  Procedure Laterality Date   APPENDECTOMY     BACK SURGERY     CATARACT EXTRACTION     HERNIA REPAIR     VASECTOMY     Family History  Problem Relation Age of Onset  Colon cancer Sister    Diabetes Brother    Heart disease Mother    Cancer Brother        brain cancer   Heart disease Brother    COPD Brother    Cancer Brother        lung cancer   Social History   Socioeconomic History   Marital status: Married    Spouse name: Joel Hopkins   Number of children: 3   Years of education: 12   Highest education level: 12th grade  Occupational History   Occupation: retired    Comment: truck Geophysicist/field seismologist  Tobacco Use   Smoking status: Never   Smokeless tobacco: Never  Scientific laboratory technician Use: Never used  Substance and Sexual Activity   Alcohol use:  Not Currently   Drug use: No   Sexual activity: Yes  Other Topics Concern   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 Drinks coffee daily. Stays active in his work around the [place] Strain: [place]  (03/26/2022)   Overall Financial Resource Strain (CARDIA)    Difficulty of Paying Living Expenses: Not hard at all  Food Insecurity: No Food Insecurity (03/26/2022)   Hunger Vital Sign    Worried About Running Out of Food in the Last Year: Never true    Ran Out of Food in the Last Year: Never true  Transportation Needs: No Transportation Needs (03/26/2022)   PRAPARE - Hydrologist (Medical): No    Lack of Transportation (Non-Medical): No  Physical Activity: Sufficiently Active (03/26/2022)   Exercise Vital Sign    Days of Exercise per Week: 3 days    Minutes of Exercise per Session: 120 min  Stress: No Stress Concern Present (03/26/2022)   [place]    Feeling of Stress : Not at all  Social Connections: [place] (03/26/2022)   Social Connection and Isolation Panel [NHANES]    Frequency of Communication with Friends and Family: More than three times a week    Frequency of Social Gatherings with Friends and Family: More than three times a week    Attends Religious Services: More than 4 times per year    Active Member of [employer] or Organizations: Yes    Attends Archivist Meetings: More than 4 times per year    Marital Status: Married    Activities of Daily Living    03/26/2022    8:13 AM  In your present state of health, do you have any difficulty performing the following activities:  Hearing? 1  Comment wears bilateral hearing aids  Vision? 0  Difficulty concentrating or making decisions? 0  Walking or climbing stairs? 0  Dressing or  bathing? 0  Doing errands, shopping? 0  Preparing Food and eating ? N  Using the Toilet? N  In the past six months, have you accidently leaked urine? N  Do you have problems with loss of bowel control? N  Managing your Medications? N  Managing your Finances? N  Housekeeping or managing your Housekeeping? N    Patient Education/ Literacy How often do you need to have someone help you when you read instructions, pamphlets, or other written materials from your doctor or pharmacy?: 1 - Never What is the last grade level you completed in school?: 12th grade  Exercise Current Exercise Habits: Home exercise routine, Time (Minutes): > 60, Frequency (Times/Week): 3, Weekly Exercise (Minutes/Week): 0, Intensity: Moderate, Exercise limited by: None identified  Diet Patient reports consuming 3 meals a day and 1 snack(s) a day Patient reports that his primary diet is: Regular Patient reports that she does have regular access to food.   Depression Screen    03/26/2022    8:10 AM 11/04/2021   10:45 AM 10/29/2020    4:14 PM 10/24/2019    9:09 AM 10/18/2018    9:22 AM 01/25/2018    9:12 AM 06/10/2017    4:05 PM  PHQ 2/9 Scores  PHQ - 2 Score 0 0 0 0 0 0 0  PHQ- 9 Score   0   0      Fall Risk    03/26/2022    8:09 AM 11/04/2021   10:45 AM 10/29/2020    4:13 PM 10/24/2019    9:08 AM 10/18/2018    9:22 AM  [place] in the past year? 1 0 1 0 0  Number falls in past yr: 0 0 0    Injury with Fall? 0 0 0 0 0  Risk for fall due to : No Fall Risks No Fall Risks No Fall Risks No Fall Risks   Follow up Falls evaluation completed Falls evaluation completed Falls evaluation completed;Education provided Falls prevention discussed Falls prevention discussed     Objective:  Joel Hopkins seemed alert and oriented and he participated appropriately during our telephone visit.  Blood Pressure Weight BMI  BP Readings from Last 3 Encounters:  11/13/21 128/67  11/11/21 125/71  11/04/21 109/66    Wt Readings from Last 3 Encounters:  11/13/21 209 lb (94.8 kg)  11/11/21 209 lb (94.8 kg)  11/04/21 214 lb (97.1 kg)   BMI Readings from Last 1 Encounters:  11/13/21 27.57 kg/m    *Unable to obtain current vital signs, weight, and BMI due to telephone visit type  Hearing/Vision  Joel Hopkins did not seem to have difficulty with hearing/understanding during the telephone conversation Reports that he has had a formal eye exam by an eye care professional within the past year Reports that he has had a formal hearing evaluation within the past year *Unable to fully assess hearing and vision during telephone visit type  Cognitive Function:    03/26/2022    8:16 AM 10/29/2020    4:17 PM 10/24/2019    9:12 AM 10/18/2018    9:29 AM  6CIT Screen  What Year? 0 points 0 points 0 points 0 points  What month? 0 points 0 points 0 points 0 points  What time? 0 points 0 points 0 points 0 points  Count back from 20 2 points 0 points 0 points 0 points  Months in reverse 2 points 2 points 0 points 0 points  Repeat phrase 0 points 0 points 0 points 0 points  Total Score 4 points 2 points 0 points 0 points   (Normal:0-7, Significant for Dysfunction: >8)  Normal Cognitive Function Screening: Yes   Immunization & Health Maintenance Record Immunization History  Administered Date(s) Administered   Fluad Quad(high Dose 65+) 07/06/2021   Influenza, High Dose Seasonal PF 08/04/2019   Influenza,inj,Quad PF,6+ Mos 06/30/2018   Influenza-Unspecified 06/15/2014, 08/07/2019, 07/20/2020   Janssen (J&J) SARS-COV-2 Vaccination 12/18/2019, 09/07/2020   Pneumococcal Conjugate-13 10/15/2015   Pneumococcal Polysaccharide-23 04/09/2017   Tdap 04/09/2017, 05/04/2018   Zoster Recombinat (Shingrix) 06/30/2018, 08/30/2018   Zoster, Live  01/30/2016    Health Maintenance  Topic Date Due   COVID-19 Vaccine (3 - Booster for Janssen series) 04/11/2022 (Originally 11/02/2020)   INFLUENZA VACCINE  05/06/2022   COLONOSCOPY  (Pts 45-48yr Insurance coverage will need to be confirmed)  08/12/2023   TETANUS/TDAP  05/04/2028   Pneumonia Vaccine 75 Years old  Completed   Hepatitis C Screening  Completed   Zoster Vaccines- Shingrix  Completed   HPV VACCINES  Aged Out       Assessment  This is a routine wellness examination for Joel Hopkins  Health Maintenance: Due or Overdue There are no preventive care reminders to display for this patient.   WCathrine Musterdoes not need a referral for Community Assistance: Care Management:   no Social Work:    no Prescription Assistance:  no Nutrition/Diabetes Education:  no   Plan:  Personalized Goals  Goals Addressed             This Visit's Progress    Patient Stated       03/26/2022 AWV Goal: Exercise for General Health  Patient will verbalize understanding of the benefits of increased physical activity: Exercising regularly is important. It will improve your overall fitness, flexibility, and endurance. Regular exercise also will improve your overall health. It can help you control your weight, reduce stress, and improve your bone density. Over the next year, patient will increase physical activity as tolerated with a goal of at least 150 minutes of moderate physical activity per week.  You can tell that you are exercising at a moderate intensity if your heart starts beating faster and you start breathing faster but can still hold a conversation. Moderate-intensity exercise ideas include: Walking 1 mile (1.6 km) in about 15 minutes Biking Hiking Golfing Dancing Water aerobics Patient will verbalize understanding of everyday activities that increase physical activity by providing examples like the following: Yard work, such as: PSales promotion account executiveGardening Washing windows or floors Patient will be able to explain general safety guidelines for exercising:  Before  you start a new exercise program, talk with your health care provider. Do not exercise so much that you hurt yourself, feel dizzy, or get very short of breath. Wear comfortable clothes and wear shoes with good support. Drink plenty of water while you exercise to prevent dehydration or heat stroke. Work out until your breathing and your heartbeat get faster.        Personalized Health Maintenance & Screening Recommendations  There are no preventive care reminders to display for this patient.  Lung Cancer Screening Recommended: no (Low Dose CT Chest recommended if Age 75-80years, 30 pack-year currently smoking OR have quit w/in past 15 years) Hepatitis C Screening recommended: no HIV Screening recommended: no  Advanced Directives: Written information was not prepared per patient's request.  Referrals & Orders No orders of the defined types were placed in this encounter.   Follow-up Plan Follow-up with MLuetta Nutting DO as planned Medicare wellness visit in one year. Patient will access AVS my chart.   I have personally reviewed and noted the following in the patient's chart:   Medical and social history Use of alcohol, tobacco or illicit drugs  Current medications and supplements Functional ability and status Nutritional status Physical activity Advanced directives List of other physicians Hospitalizations, surgeries, and ER visits in previous 12 months Vitals Screenings to include cognitive, depression, and falls Referrals and appointments  In addition, I have reviewed and discussed with Joel Hopkins certain preventive protocols, quality metrics, and best practice recommendations. A written personalized care plan for preventive services as well as general preventive health recommendations is available and can be mailed to the patient at his request.      Tinnie Gens, RN-BSN  03/26/2022

## 2022-03-26 NOTE — Patient Instructions (Addendum)
[facility] Maintenance Summary and Written Plan of Care  [name] ,  Thank you for allowing me to perform your Medicare Annual Wellness Visit and for your ongoing commitment to your health.   Health Maintenance & Immunization History Health Maintenance  Topic Date Due  . COVID-19 Vaccine (3 - Booster for Janssen series) 04/11/2022 (Originally 11/02/2020)  . INFLUENZA VACCINE  05/06/2022  . COLONOSCOPY (Pts 45-46yr Insurance coverage will need to be confirmed)  08/12/2023  . TETANUS/TDAP  05/04/2028  . Pneumonia Vaccine 75 Years old  Completed  . Hepatitis C Screening  Completed  . Zoster Vaccines- Shingrix  Completed  . HPV VACCINES  Aged Out   Immunization History  Administered Date(s) Administered  . Fluad Quad(high Dose 65+) 07/06/2021  . Influenza, High Dose Seasonal PF 08/04/2019  . Influenza,inj,Quad PF,6+ Mos 06/30/2018  . Influenza-Unspecified 06/15/2014, 08/07/2019, 07/20/2020  . Janssen (J&J) SARS-COV-2 Vaccination 12/18/2019, 09/07/2020  . Pneumococcal Conjugate-13 10/15/2015  . Pneumococcal Polysaccharide-23 04/09/2017  . Tdap 04/09/2017, 05/04/2018  . Zoster Recombinat (Shingrix) 06/30/2018, 08/30/2018  . Zoster, Live 01/30/2016    These are the patient goals that we discussed:  Goals Addressed            This Visit's Progress   . Patient Stated       03/26/2022 AWV Goal: Exercise for General Health  Patient will verbalize understanding of the benefits of increased physical activity: Exercising regularly is important. It will improve your overall fitness, flexibility, and endurance. Regular exercise also will improve your overall health. It can help you control your weight, reduce stress, and improve your bone density. Over the next year, patient will increase physical activity as tolerated with a goal of at least 150 minutes of moderate physical activity per week.  You can tell that you are exercising at a moderate  intensity if your heart starts beating faster and you start breathing faster but can still hold a conversation. Moderate-intensity exercise ideas include: Walking 1 mile (1.6 km) in about 15 minutes Biking Hiking Golfing Dancing Water aerobics Patient will verbalize understanding of everyday activities that increase physical activity by providing examples like the following: Yard work, such as: PSales promotion account executiveGardening Washing windows or floors Patient will be able to explain general safety guidelines for exercising:  Before you start a new exercise program, talk with your health care provider. Do not exercise so much that you hurt yourself, feel dizzy, or get very short of breath. Wear comfortable clothes and wear shoes with good support. Drink plenty of water while you exercise to prevent dehydration or heat stroke. Work out until your breathing and your heartbeat get faster.         This is a list of Health Maintenance Items that are overdue or due now: There are no preventive care reminders to display for this patient.   Orders/Referrals Placed Today: No orders of the defined types were placed in this encounter.  (Contact our referral department at [phone] you have not spoken with someone about your referral appointment within the next 5 days)    Follow-up Plan Follow-up with MLuetta Nutting DO as planned Medicare wellness visit in one year. Patient will access AVS my chart.      Health Maintenance, Male Adopting a healthy lifestyle and getting preventive care are important in promoting health and wellness. Ask your health care provider about: The right schedule for  you to have regular tests and exams. Things you can do on your own to prevent diseases and keep yourself healthy. What should I know about diet, weight, and exercise? Eat a healthy diet  Eat a diet that  includes plenty of vegetables, fruits, low-fat dairy products, and lean protein. Do not eat a lot of foods that are high in solid fats, added sugars, or sodium. Maintain a healthy weight Body mass index (BMI) is a measurement that can be used to identify possible weight problems. It estimates body fat based on height and weight. Your health care provider can help determine your BMI and help you achieve or maintain a healthy weight. Get regular exercise Get regular exercise. This is one of the most important things you can do for your health. Most adults should: Exercise for at least 150 minutes each week. The exercise should increase your heart rate and make you sweat (moderate-intensity exercise). Do strengthening exercises at least twice a week. This is in addition to the moderate-intensity exercise. Spend less time sitting. Even light physical activity can be beneficial. Watch cholesterol and blood lipids Have your blood tested for lipids and cholesterol at 75 years of age, then have this test every 5 years. You may need to have your cholesterol levels checked more often if: Your lipid or cholesterol levels are high. You are older than 75 years of age. You are at high risk for heart disease. What should I know about cancer screening? Many types of cancers can be detected early and may often be prevented. Depending on your health history and family history, you may need to have cancer screening at various ages. This may include screening for: Colorectal cancer. Prostate cancer. Skin cancer. Lung cancer. What should I know about heart disease, diabetes, and high blood pressure? Blood pressure and heart disease High blood pressure causes heart disease and increases the risk of stroke. This is more likely to develop in people who have high blood pressure readings or are overweight. Talk with your health care provider about your target blood pressure readings. Have your blood pressure  checked: Every 3-5 years if you are 75-25 years of age. Every year if you are 75 years old or older. If you are between the ages of 19 and 36 and are a current or former smoker, ask your health care provider if you should have a one-time screening for abdominal aortic aneurysm (AAA). Diabetes Have regular diabetes screenings. This checks your fasting blood sugar level. Have the screening done: Once every three years after age 33 if you are at a normal weight and have a low risk for diabetes. More often and at a younger age if you are overweight or have a high risk for diabetes. What should I know about preventing infection? Hepatitis B If you have a higher risk for hepatitis B, you should be screened for this virus. Talk with your health care provider to find out if you are at risk for hepatitis B infection. Hepatitis C Blood testing is recommended for: Everyone born from [birth year] through 1964. Anyone with known risk factors for hepatitis C. Sexually transmitted infections (STIs) You should be screened each year for STIs, including gonorrhea and chlamydia, if: You are sexually active and are younger than 75 years of age. You are older than 75 years of age and your health care provider tells you that you are at risk for this type of infection. Your sexual activity has changed since you were last screened, and you  are at increased risk for chlamydia or gonorrhea. Ask your health care provider if you are at risk. Ask your health care provider about whether you are at high risk for HIV. Your health care provider may recommend a prescription medicine to help prevent HIV infection. If you choose to take medicine to prevent HIV, you should first get tested for HIV. You should then be tested every 3 months for as long as you are taking the medicine. Follow these instructions at home: Alcohol use Do not drink alcohol if your health care provider tells you not to drink. If you drink alcohol: Limit how  much you have to 0-2 drinks a day. Know how much alcohol is in your drink. In the [place] one drink equals one 12 oz bottle of beer (355 mL), one 5 oz glass of wine (148 mL), or one 1 oz glass of hard liquor (44 mL). Lifestyle Do not use any products that contain nicotine or tobacco. These products include cigarettes, chewing tobacco, and vaping devices, such as e-cigarettes. If you need help quitting, ask your health care provider. Do not use street drugs. Do not share needles. Ask your health care provider for help if you need support or information about quitting drugs. General instructions Schedule regular health, dental, and eye exams. Stay current with your vaccines. Tell your health care provider if: You often feel depressed. You have ever been abused or do not feel safe at home. Summary Adopting a healthy lifestyle and getting preventive care are important in promoting health and wellness. Follow your health care provider's instructions about healthy diet, exercising, and getting tested or screened for diseases. Follow your health care provider's instructions on monitoring your cholesterol and blood pressure. This information is not intended to replace advice given to you by your health care provider. Make sure you discuss any questions you have with your health care provider. Document Revised: 02/11/2021 Document Reviewed: 02/11/2021 Elsevier Patient Education  [place].

## 2022-05-29 DIAGNOSIS — H524 Presbyopia: Secondary | ICD-10-CM | POA: Diagnosis not present

## 2022-06-12 DIAGNOSIS — L820 Inflamed seborrheic keratosis: Secondary | ICD-10-CM | POA: Diagnosis not present

## 2022-06-12 DIAGNOSIS — L570 Actinic keratosis: Secondary | ICD-10-CM | POA: Diagnosis not present

## 2022-06-12 DIAGNOSIS — Z129 Encounter for screening for malignant neoplasm, site unspecified: Secondary | ICD-10-CM | POA: Diagnosis not present

## 2022-06-12 DIAGNOSIS — Z85820 Personal history of malignant melanoma of skin: Secondary | ICD-10-CM | POA: Diagnosis not present

## 2022-06-12 DIAGNOSIS — L821 Other seborrheic keratosis: Secondary | ICD-10-CM | POA: Diagnosis not present

## 2022-06-12 DIAGNOSIS — L918 Other hypertrophic disorders of the skin: Secondary | ICD-10-CM | POA: Diagnosis not present

## 2022-06-12 DIAGNOSIS — D485 Neoplasm of uncertain behavior of skin: Secondary | ICD-10-CM | POA: Diagnosis not present

## 2022-09-03 ENCOUNTER — Ambulatory Visit (INDEPENDENT_AMBULATORY_CARE_PROVIDER_SITE_OTHER): Payer: Medicare HMO

## 2022-09-03 ENCOUNTER — Ambulatory Visit (INDEPENDENT_AMBULATORY_CARE_PROVIDER_SITE_OTHER): Payer: Medicare HMO | Admitting: Family Medicine

## 2022-09-03 ENCOUNTER — Encounter: Payer: Self-pay | Admitting: Family Medicine

## 2022-09-03 DIAGNOSIS — M25561 Pain in right knee: Secondary | ICD-10-CM

## 2022-09-03 DIAGNOSIS — E039 Hypothyroidism, unspecified: Secondary | ICD-10-CM

## 2022-09-03 DIAGNOSIS — R972 Elevated prostate specific antigen [PSA]: Secondary | ICD-10-CM | POA: Diagnosis not present

## 2022-09-03 DIAGNOSIS — M4316 Spondylolisthesis, lumbar region: Secondary | ICD-10-CM | POA: Diagnosis not present

## 2022-09-03 DIAGNOSIS — E785 Hyperlipidemia, unspecified: Secondary | ICD-10-CM | POA: Diagnosis not present

## 2022-09-03 DIAGNOSIS — M545 Low back pain, unspecified: Secondary | ICD-10-CM | POA: Diagnosis not present

## 2022-09-03 DIAGNOSIS — M25562 Pain in left knee: Secondary | ICD-10-CM | POA: Diagnosis not present

## 2022-09-03 DIAGNOSIS — M542 Cervicalgia: Secondary | ICD-10-CM | POA: Diagnosis not present

## 2022-09-03 MED ORDER — MELOXICAM 15 MG PO TABS: ORAL_TABLET | ORAL | 0 refills | Status: DC

## 2022-09-03 NOTE — Assessment & Plan Note (Signed)
Update PSA today.  

## 2022-09-03 NOTE — Patient Instructions (Signed)
Try meloxicam daily as needed for pain/stiffness.  We'll be in touch with xray results.
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
## 2022-09-03 NOTE — Assessment & Plan Note (Signed)
Updated lipid panel.

## 2022-09-03 NOTE — Assessment & Plan Note (Signed)
X-rays of the cervical spine, lumbar spine and bilateral knees ordered today.  IV meloxicam and use as needed.  Recommend ice application to sore areas.

## 2022-09-03 NOTE — Assessment & Plan Note (Signed)
Update TSH

## 2022-09-04 ENCOUNTER — Encounter: Payer: Self-pay | Admitting: Family Medicine

## 2022-09-04 LAB — COMPLETE METABOLIC PANEL WITH GFR
AG Ratio: 1.9 (calc) (ref 1.0–2.5)
ALT: 21 U/L (ref 9–46)
AST: 16 U/L (ref 10–35)
Albumin: 4.2 g/dL (ref 3.6–5.1)
Alkaline phosphatase (APISO): 55 U/L (ref 35–144)
BUN/Creatinine Ratio: 14 (calc) (ref 6–22)
BUN: 19 mg/dL (ref 7–25)
CO2: 29 mmol/L (ref 20–32)
Calcium: 9.4 mg/dL (ref 8.6–10.3)
Chloride: 105 mmol/L (ref 98–110)
Creat: 1.39 mg/dL — ABNORMAL HIGH (ref 0.70–1.28)
Globulin: 2.2 g/dL (calc) (ref 1.9–3.7)
Glucose, Bld: 97 mg/dL (ref 65–99)
Potassium: 5.1 mmol/L (ref 3.5–5.3)
Sodium: 141 mmol/L (ref 135–146)
Total Bilirubin: 0.5 mg/dL (ref 0.2–1.2)
Total Protein: 6.4 g/dL (ref 6.1–8.1)
eGFR: 53 mL/min/{1.73_m2} — ABNORMAL LOW (ref >=60)

## 2022-09-04 LAB — CBC WITH DIFFERENTIAL/PLATELET
Absolute Monocytes: 506 cells/uL (ref 200–950)
Basophils Absolute: 47 cells/uL (ref 0–200)
Basophils Relative: 0.6 %
Eosinophils Absolute: 158 cells/uL (ref 15–500)
Eosinophils Relative: 2 %
HCT: 45.8 % (ref 38.5–50.0)
Hemoglobin: 15.4 g/dL (ref 13.2–17.1)
Lymphs Abs: 1406 cells/uL (ref 850–3900)
MCH: 31.6 pg (ref 27.0–33.0)
MCHC: 33.6 g/dL (ref 32.0–36.0)
MCV: 93.9 fL (ref 80.0–100.0)
MPV: 11.1 fL (ref 7.5–12.5)
Monocytes Relative: 6.4 %
Neutro Abs: 5783 cells/uL (ref 1500–7800)
Neutrophils Relative %: 73.2 %
Platelets: 74 10*3/uL — ABNORMAL LOW (ref 140–400)
RBC: 4.88 10*6/uL (ref 4.20–5.80)
RDW: 12.3 % (ref 11.0–15.0)
Total Lymphocyte: 17.8 %
WBC: 7.9 10*3/uL (ref 3.8–10.8)

## 2022-09-04 LAB — LIPID PANEL W/REFLEX DIRECT LDL
Cholesterol: 182 mg/dL (ref <200)
HDL: 51 mg/dL (ref >=40)
LDL Cholesterol (Calc): 114 mg/dL (calc) — ABNORMAL HIGH
Non-HDL Cholesterol (Calc): 131 mg/dL (calc) — ABNORMAL HIGH (ref <130)
Total CHOL/HDL Ratio: 3.6 (calc) (ref <5.0)
Triglycerides: 74 mg/dL (ref <150)

## 2022-09-04 LAB — TSH: TSH: 4.1 mIU/L (ref 0.40–4.50)

## 2022-09-04 LAB — PSA: PSA: 9.4 ng/mL — ABNORMAL HIGH (ref <=4.00)

## 2022-09-05 ENCOUNTER — Other Ambulatory Visit: Payer: Self-pay | Admitting: Family Medicine

## 2022-09-05 MED ORDER — LEVOTHYROXINE SODIUM 88 MCG PO TABS: ORAL_TABLET | ORAL | 3 refills | Status: DC

## 2022-09-10 ENCOUNTER — Ambulatory Visit (INDEPENDENT_AMBULATORY_CARE_PROVIDER_SITE_OTHER): Payer: Medicare HMO | Admitting: Family Medicine

## 2022-09-10 ENCOUNTER — Encounter: Payer: Self-pay | Admitting: Family Medicine

## 2022-09-10 VITALS — BP 118/65 | HR 54 | Ht 73.0 in | Wt 205.1 lb

## 2022-09-10 DIAGNOSIS — M62838 Other muscle spasm: Secondary | ICD-10-CM | POA: Diagnosis not present

## 2022-09-10 MED ORDER — METHOCARBAMOL 500 MG PO TABS: 500.0000 mg | ORAL_TABLET | Freq: Three times a day (TID) | ORAL | 0 refills | Status: DC | PRN

## 2022-09-10 NOTE — Patient Instructions (Addendum)
Try biofreeze or icyhot on back of the neck.  Try robaxin as needed muscle spasm
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
## 2022-09-10 NOTE — Assessment & Plan Note (Signed)
Continue meloxicam daily.  Adding Robaxin every 8 hours as needed.  We discussed potential for sedation with this.  He can try topical muscle rubs as well.

## 2022-09-16 ENCOUNTER — Ambulatory Visit (INDEPENDENT_AMBULATORY_CARE_PROVIDER_SITE_OTHER): Payer: Medicare HMO

## 2022-09-16 ENCOUNTER — Encounter: Payer: Self-pay | Admitting: Family Medicine

## 2022-09-16 ENCOUNTER — Ambulatory Visit (INDEPENDENT_AMBULATORY_CARE_PROVIDER_SITE_OTHER): Payer: Medicare HMO | Admitting: Family Medicine

## 2022-09-16 VITALS — BP 108/62 | HR 65 | Ht 73.0 in | Wt 205.1 lb

## 2022-09-16 DIAGNOSIS — R42 Dizziness and giddiness: Secondary | ICD-10-CM

## 2022-09-16 DIAGNOSIS — S0990XD Unspecified injury of head, subsequent encounter: Secondary | ICD-10-CM | POA: Diagnosis not present

## 2022-09-16 MED ORDER — PREDNISONE 50 MG PO TABS: ORAL_TABLET | ORAL | 0 refills | Status: DC

## 2022-09-16 MED ORDER — MECLIZINE HCL 25 MG PO TABS: 25.0000 mg | ORAL_TABLET | Freq: Three times a day (TID) | ORAL | 0 refills | Status: DC | PRN
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
## 2022-09-16 NOTE — Patient Instructions (Signed)
Try meclizine for dizziness Stay well hydrated.   Add prednisone for muscle inflammation/tightness.   Let me know if symptoms persist
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
## 2022-09-17 MED ORDER — PREDNISONE 50 MG PO TABS: ORAL_TABLET | ORAL | 0 refills | Status: DC

## 2022-09-28 DIAGNOSIS — R059 Cough, unspecified: Secondary | ICD-10-CM | POA: Diagnosis not present

## 2022-09-28 DIAGNOSIS — J986 Disorders of diaphragm: Secondary | ICD-10-CM | POA: Diagnosis not present

## 2022-09-28 DIAGNOSIS — R051 Acute cough: Secondary | ICD-10-CM | POA: Diagnosis not present

## 2022-09-28 DIAGNOSIS — Z79899 Other long term (current) drug therapy: Secondary | ICD-10-CM | POA: Diagnosis not present

## 2022-09-28 DIAGNOSIS — J101 Influenza due to other identified influenza virus with other respiratory manifestations: Secondary | ICD-10-CM | POA: Diagnosis not present

## 2022-09-28 DIAGNOSIS — R0789 Other chest pain: Secondary | ICD-10-CM | POA: Diagnosis not present

## 2022-09-28 DIAGNOSIS — E039 Hypothyroidism, unspecified: Secondary | ICD-10-CM | POA: Diagnosis not present

## 2022-09-28 DIAGNOSIS — R0602 Shortness of breath: Secondary | ICD-10-CM | POA: Diagnosis not present

## 2022-09-28 DIAGNOSIS — Z1152 Encounter for screening for COVID-19: Secondary | ICD-10-CM | POA: Diagnosis not present

## 2022-10-01 ENCOUNTER — Ambulatory Visit (INDEPENDENT_AMBULATORY_CARE_PROVIDER_SITE_OTHER): Payer: Medicare HMO

## 2022-10-01 ENCOUNTER — Ambulatory Visit (INDEPENDENT_AMBULATORY_CARE_PROVIDER_SITE_OTHER): Payer: Medicare HMO | Admitting: Family Medicine

## 2022-10-01 ENCOUNTER — Encounter: Payer: Self-pay | Admitting: Family Medicine

## 2022-10-01 VITALS — BP 108/70 | HR 90 | Temp 98.3°F | Ht 73.0 in | Wt 196.1 lb

## 2022-10-01 DIAGNOSIS — S40022S Contusion of left upper arm, sequela: Secondary | ICD-10-CM | POA: Diagnosis not present

## 2022-10-01 DIAGNOSIS — R059 Cough, unspecified: Secondary | ICD-10-CM | POA: Diagnosis not present

## 2022-10-01 DIAGNOSIS — J101 Influenza due to other identified influenza virus with other respiratory manifestations: Secondary | ICD-10-CM

## 2022-10-01 DIAGNOSIS — R0602 Shortness of breath: Secondary | ICD-10-CM

## 2022-10-01 MED ORDER — METHYLPREDNISOLONE 4 MG PO TBPK: ORAL_TABLET | ORAL | 0 refills | Status: DC

## 2022-10-01 MED ORDER — ALBUTEROL SULFATE (2.5 MG/3ML) 0.083% IN NEBU: 2.5000 mg | INHALATION_SOLUTION | Freq: Once | RESPIRATORY_TRACT | Status: DC

## 2022-10-01 MED ORDER — ALBUTEROL SULFATE (2.5 MG/3ML) 0.083% IN NEBU: 2.5000 mg | INHALATION_SOLUTION | Freq: Four times a day (QID) | RESPIRATORY_TRACT | 1 refills | Status: DC | PRN

## 2022-10-01 MED ORDER — IPRATROPIUM-ALBUTEROL 0.5-2.5 (3) MG/3ML IN SOLN: 3.0000 mL | Freq: Once | RESPIRATORY_TRACT | Status: DC

## 2022-10-01 MED ORDER — BENZONATATE 200 MG PO CAPS: 200.0000 mg | ORAL_CAPSULE | Freq: Two times a day (BID) | ORAL | 0 refills | Status: DC | PRN
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
## 2022-10-09 ENCOUNTER — Ambulatory Visit (INDEPENDENT_AMBULATORY_CARE_PROVIDER_SITE_OTHER): Payer: Medicare HMO | Admitting: Family Medicine

## 2022-10-09 ENCOUNTER — Encounter: Payer: Self-pay | Admitting: Family Medicine

## 2022-10-09 VITALS — BP 106/64 | HR 89 | Temp 98.0°F | Ht 73.0 in | Wt 196.4 lb

## 2022-10-09 DIAGNOSIS — R252 Cramp and spasm: Secondary | ICD-10-CM | POA: Diagnosis not present

## 2022-10-09 DIAGNOSIS — M542 Cervicalgia: Secondary | ICD-10-CM | POA: Diagnosis not present

## 2022-10-09 DIAGNOSIS — Z9889 Other specified postprocedural states: Secondary | ICD-10-CM

## 2022-10-09 DIAGNOSIS — I83891 Varicose veins of right lower extremities with other complications: Secondary | ICD-10-CM | POA: Diagnosis not present

## 2022-10-09 MED ORDER — BACLOFEN 10 MG PO TABS: 10.0000 mg | ORAL_TABLET | Freq: Two times a day (BID) | ORAL | 0 refills | Status: DC | PRN
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
## 2022-10-10 ENCOUNTER — Encounter: Payer: Self-pay | Admitting: Family Medicine

## 2022-10-10 LAB — BASIC METABOLIC PANEL
BUN/Creatinine Ratio: 15 (ref 10–24)
BUN: 21 mg/dL (ref 8–27)
CO2: 26 mmol/L (ref 20–29)
Calcium: 8.9 mg/dL (ref 8.6–10.2)
Chloride: 101 mmol/L (ref 96–106)
Creatinine, Ser: 1.38 mg/dL — ABNORMAL HIGH (ref 0.76–1.27)
Glucose: 114 mg/dL — ABNORMAL HIGH (ref 70–99)
Potassium: 5.1 mmol/L (ref 3.5–5.2)
Sodium: 139 mmol/L (ref 134–144)
eGFR: 53 mL/min/{1.73_m2} — ABNORMAL LOW (ref >59)

## 2022-10-10 LAB — MAGNESIUM: Magnesium: 2.3 mg/dL (ref 1.6–2.3)

## 2022-10-13 ENCOUNTER — Ambulatory Visit (INDEPENDENT_AMBULATORY_CARE_PROVIDER_SITE_OTHER): Payer: Medicare HMO

## 2022-10-13 DIAGNOSIS — M5412 Radiculopathy, cervical region: Secondary | ICD-10-CM | POA: Diagnosis not present

## 2022-10-13 DIAGNOSIS — M7989 Other specified soft tissue disorders: Secondary | ICD-10-CM | POA: Diagnosis not present

## 2022-10-13 DIAGNOSIS — R2241 Localized swelling, mass and lump, right lower limb: Secondary | ICD-10-CM | POA: Diagnosis not present

## 2022-10-13 DIAGNOSIS — Z9889 Other specified postprocedural states: Secondary | ICD-10-CM | POA: Diagnosis not present

## 2022-10-13 DIAGNOSIS — M47812 Spondylosis without myelopathy or radiculopathy, cervical region: Secondary | ICD-10-CM | POA: Diagnosis not present

## 2022-10-13 DIAGNOSIS — M542 Cervicalgia: Secondary | ICD-10-CM | POA: Diagnosis not present

## 2022-10-13 DIAGNOSIS — I83891 Varicose veins of right lower extremities with other complications: Secondary | ICD-10-CM

## 2022-10-13 MED ORDER — IOHEXOL 300 MG/ML  SOLN
100.0000 mL | Freq: Once | INTRAMUSCULAR | Status: AC | PRN
  Administered 2022-10-13: 75 mL via INTRAVENOUS

## 2022-10-15 ENCOUNTER — Other Ambulatory Visit: Payer: Self-pay | Admitting: Family Medicine

## 2022-10-15 DIAGNOSIS — M5412 Radiculopathy, cervical region: Secondary | ICD-10-CM

## 2022-10-20 ENCOUNTER — Ambulatory Visit (INDEPENDENT_AMBULATORY_CARE_PROVIDER_SITE_OTHER): Payer: Medicare HMO | Admitting: Sports Medicine

## 2022-10-20 DIAGNOSIS — M47816 Spondylosis without myelopathy or radiculopathy, lumbar region: Secondary | ICD-10-CM

## 2022-10-20 DIAGNOSIS — F0631 Mood disorder due to known physiological condition with depressive features: Secondary | ICD-10-CM | POA: Diagnosis not present

## 2022-10-20 DIAGNOSIS — M5412 Radiculopathy, cervical region: Secondary | ICD-10-CM

## 2022-10-20 MED ORDER — GABAPENTIN 300 MG PO CAPS: 300.0000 mg | ORAL_CAPSULE | Freq: Every day | ORAL | 3 refills | Status: DC

## 2022-10-20 MED ORDER — CELECOXIB 200 MG PO CAPS: ORAL_CAPSULE | ORAL | 2 refills | Status: DC

## 2022-10-20 MED ORDER — HYDROCODONE-ACETAMINOPHEN 5-325 MG PO TABS: 1.0000 | ORAL_TABLET | Freq: Three times a day (TID) | ORAL | 0 refills | Status: DC | PRN
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
## 2022-10-20 NOTE — Assessment & Plan Note (Signed)
Also having increasing sided low back pain with radiation down the left hip, in the right leg to the calf. Lumbar x-rays did show significant degenerative disc disease and facet arthritis as well as spondylolisthesis. I again explained him the acute on chronic nature of this process. As above we will do formal physical therapy, Neurontin, I would like him to have a bit of hydrocodone on board for breakthrough pain, and he will switch to Celebrex from meloxicam. We will do this for 6 weeks before considering additional intervention in the form of either an epidural injection or facet injection though he has had symptoms for greater than 6 weeks already in spite of physician directed conservative treatment so we will go ahead and get him teed up with a lumbar spine MRI.

## 2022-10-21 ENCOUNTER — Other Ambulatory Visit: Payer: Self-pay

## 2022-10-21 ENCOUNTER — Ambulatory Visit: Payer: Medicare HMO | Attending: Family Medicine | Admitting: Physical Therapy

## 2022-10-21 ENCOUNTER — Encounter: Payer: Self-pay | Admitting: Physical Therapy

## 2022-10-21 DIAGNOSIS — M6281 Muscle weakness (generalized): Secondary | ICD-10-CM | POA: Diagnosis not present

## 2022-10-21 DIAGNOSIS — M62830 Muscle spasm of back: Secondary | ICD-10-CM | POA: Diagnosis present

## 2022-10-21 DIAGNOSIS — M542 Cervicalgia: Secondary | ICD-10-CM | POA: Insufficient documentation

## 2022-10-21 DIAGNOSIS — M5459 Other low back pain: Secondary | ICD-10-CM | POA: Diagnosis not present

## 2022-10-21 DIAGNOSIS — M5412 Radiculopathy, cervical region: Secondary | ICD-10-CM | POA: Insufficient documentation

## 2022-10-21 DIAGNOSIS — R2689 Other abnormalities of gait and mobility: Secondary | ICD-10-CM | POA: Insufficient documentation
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
## 2022-10-23 ENCOUNTER — Ambulatory Visit: Payer: Medicare HMO

## 2022-10-23 DIAGNOSIS — M5459 Other low back pain: Secondary | ICD-10-CM | POA: Diagnosis not present

## 2022-10-23 DIAGNOSIS — M62830 Muscle spasm of back: Secondary | ICD-10-CM | POA: Diagnosis not present

## 2022-10-23 DIAGNOSIS — R2689 Other abnormalities of gait and mobility: Secondary | ICD-10-CM | POA: Diagnosis not present

## 2022-10-23 DIAGNOSIS — M542 Cervicalgia: Secondary | ICD-10-CM | POA: Diagnosis not present

## 2022-10-23 DIAGNOSIS — M6281 Muscle weakness (generalized): Secondary | ICD-10-CM

## 2022-10-23 DIAGNOSIS — M5412 Radiculopathy, cervical region: Secondary | ICD-10-CM | POA: Diagnosis not present
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
## 2022-10-25 ENCOUNTER — Ambulatory Visit (INDEPENDENT_AMBULATORY_CARE_PROVIDER_SITE_OTHER): Payer: Medicare HMO

## 2022-10-25 DIAGNOSIS — M48061 Spinal stenosis, lumbar region without neurogenic claudication: Secondary | ICD-10-CM | POA: Diagnosis not present

## 2022-10-25 DIAGNOSIS — M47816 Spondylosis without myelopathy or radiculopathy, lumbar region: Secondary | ICD-10-CM

## 2022-10-25 DIAGNOSIS — M5126 Other intervertebral disc displacement, lumbar region: Secondary | ICD-10-CM | POA: Diagnosis not present

## 2022-10-28 ENCOUNTER — Ambulatory Visit: Payer: Medicare HMO | Admitting: Physical Therapy

## 2022-10-28 ENCOUNTER — Encounter: Payer: Self-pay | Admitting: Physical Therapy

## 2022-10-28 DIAGNOSIS — M5459 Other low back pain: Secondary | ICD-10-CM

## 2022-10-28 DIAGNOSIS — M62830 Muscle spasm of back: Secondary | ICD-10-CM | POA: Diagnosis not present

## 2022-10-28 DIAGNOSIS — M542 Cervicalgia: Secondary | ICD-10-CM | POA: Diagnosis not present

## 2022-10-28 DIAGNOSIS — M6281 Muscle weakness (generalized): Secondary | ICD-10-CM | POA: Diagnosis not present

## 2022-10-28 DIAGNOSIS — M5412 Radiculopathy, cervical region: Secondary | ICD-10-CM | POA: Diagnosis not present

## 2022-10-28 DIAGNOSIS — R2689 Other abnormalities of gait and mobility: Secondary | ICD-10-CM
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
## 2022-10-30 ENCOUNTER — Ambulatory Visit: Payer: Medicare HMO | Admitting: Physical Therapy

## 2022-10-30 ENCOUNTER — Encounter: Payer: Self-pay | Admitting: Physical Therapy

## 2022-10-30 DIAGNOSIS — M62830 Muscle spasm of back: Secondary | ICD-10-CM | POA: Diagnosis not present

## 2022-10-30 DIAGNOSIS — M542 Cervicalgia: Secondary | ICD-10-CM

## 2022-10-30 DIAGNOSIS — M6281 Muscle weakness (generalized): Secondary | ICD-10-CM | POA: Diagnosis not present

## 2022-10-30 DIAGNOSIS — M5412 Radiculopathy, cervical region: Secondary | ICD-10-CM | POA: Diagnosis not present

## 2022-10-30 DIAGNOSIS — M5459 Other low back pain: Secondary | ICD-10-CM | POA: Diagnosis not present

## 2022-10-30 DIAGNOSIS — R2689 Other abnormalities of gait and mobility: Secondary | ICD-10-CM
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
## 2022-11-04 ENCOUNTER — Ambulatory Visit: Payer: Medicare HMO

## 2022-11-04 DIAGNOSIS — M5412 Radiculopathy, cervical region: Secondary | ICD-10-CM | POA: Diagnosis not present

## 2022-11-04 DIAGNOSIS — M6281 Muscle weakness (generalized): Secondary | ICD-10-CM

## 2022-11-04 DIAGNOSIS — R2689 Other abnormalities of gait and mobility: Secondary | ICD-10-CM

## 2022-11-04 DIAGNOSIS — M62830 Muscle spasm of back: Secondary | ICD-10-CM

## 2022-11-04 DIAGNOSIS — M5459 Other low back pain: Secondary | ICD-10-CM | POA: Diagnosis not present

## 2022-11-04 DIAGNOSIS — M542 Cervicalgia: Secondary | ICD-10-CM | POA: Diagnosis not present
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
## 2022-11-06 ENCOUNTER — Encounter: Payer: Self-pay | Admitting: Physical Therapy

## 2022-11-06 ENCOUNTER — Ambulatory Visit: Payer: Medicare HMO | Attending: Sports Medicine | Admitting: Physical Therapy

## 2022-11-06 DIAGNOSIS — M62830 Muscle spasm of back: Secondary | ICD-10-CM

## 2022-11-06 DIAGNOSIS — R2689 Other abnormalities of gait and mobility: Secondary | ICD-10-CM | POA: Insufficient documentation

## 2022-11-06 DIAGNOSIS — M542 Cervicalgia: Secondary | ICD-10-CM | POA: Insufficient documentation

## 2022-11-06 DIAGNOSIS — M5459 Other low back pain: Secondary | ICD-10-CM | POA: Diagnosis present

## 2022-11-06 DIAGNOSIS — M6281 Muscle weakness (generalized): Secondary | ICD-10-CM | POA: Diagnosis present
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
## 2022-11-11 ENCOUNTER — Ambulatory Visit: Payer: Medicare HMO | Admitting: Physical Therapy

## 2022-11-11 ENCOUNTER — Encounter: Payer: Self-pay | Admitting: Physical Therapy

## 2022-11-11 DIAGNOSIS — R2689 Other abnormalities of gait and mobility: Secondary | ICD-10-CM

## 2022-11-11 DIAGNOSIS — M5459 Other low back pain: Secondary | ICD-10-CM

## 2022-11-11 DIAGNOSIS — M542 Cervicalgia: Secondary | ICD-10-CM

## 2022-11-11 DIAGNOSIS — M62830 Muscle spasm of back: Secondary | ICD-10-CM | POA: Diagnosis not present

## 2022-11-11 DIAGNOSIS — M6281 Muscle weakness (generalized): Secondary | ICD-10-CM
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
## 2022-11-13 ENCOUNTER — Ambulatory Visit: Payer: Medicare HMO | Admitting: Physical Therapy

## 2022-11-13 ENCOUNTER — Encounter: Payer: Self-pay | Admitting: Physical Therapy

## 2022-11-13 DIAGNOSIS — M542 Cervicalgia: Secondary | ICD-10-CM | POA: Diagnosis not present

## 2022-11-13 DIAGNOSIS — M6281 Muscle weakness (generalized): Secondary | ICD-10-CM

## 2022-11-13 DIAGNOSIS — R2689 Other abnormalities of gait and mobility: Secondary | ICD-10-CM

## 2022-11-13 DIAGNOSIS — M62830 Muscle spasm of back: Secondary | ICD-10-CM | POA: Diagnosis not present

## 2022-11-13 DIAGNOSIS — M5459 Other low back pain: Secondary | ICD-10-CM
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
## 2022-12-01 ENCOUNTER — Ambulatory Visit (INDEPENDENT_AMBULATORY_CARE_PROVIDER_SITE_OTHER): Payer: Medicare HMO | Admitting: Sports Medicine

## 2022-12-01 DIAGNOSIS — M5412 Radiculopathy, cervical region: Secondary | ICD-10-CM | POA: Diagnosis not present

## 2022-12-01 DIAGNOSIS — F0631 Mood disorder due to known physiological condition with depressive features: Secondary | ICD-10-CM | POA: Diagnosis not present

## 2022-12-01 DIAGNOSIS — M47816 Spondylosis without myelopathy or radiculopathy, lumbar region: Secondary | ICD-10-CM | POA: Diagnosis not present

## 2022-12-01 DIAGNOSIS — Z0000 Encounter for general adult medical examination without abnormal findings: Secondary | ICD-10-CM

## 2022-12-01 MED ORDER — DULOXETINE HCL 30 MG PO CPEP: 30.0000 mg | ORAL_CAPSULE | Freq: Every day | ORAL | 3 refills | Status: DC

## 2022-12-01 NOTE — Assessment & Plan Note (Signed)
I did confirm severe spinal stenosis L4-L5. As above with his cervical spine conservative treatment has not been effective so we will proceed with a lumbar epidural with Dr. Francesco Runner. 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

## 2022-12-01 NOTE — Assessment & Plan Note (Signed)
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 Lynann Bologna. He did have a CT that showed a C4-C5 disc protrusion. He has had a few courses of steroids, home physical therapy, did not tolerate NSAIDs or muscle relaxers. We also added Neurontin, Celebrex which has not provided sufficient relief. I think he is a candidate now for cervical epidural, he would like to stay in [place] so we will work with Dr. Francesco Runner. 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

## 2022-12-01 NOTE — Assessment & Plan Note (Signed)
Joel Hopkins depression is worsening, continued anhedonia, irritability, frustration, has now lost over 10 pounds. At this point he is agreeable to starting antidepressant at least for short-term, starting Cymbalta. He will discontinue gabapentin for now as it was not effective.

## 2022-12-01 NOTE — [place] Notes (Addendum)
    Procedures performed today:    None.  Independent interpretation of notes and tests performed by another provider:   None.  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 Yevette Edwards. He did have a CT that showed a C4-C5 disc protrusion. He has had a few courses of steroids, home physical therapy, did not tolerate NSAIDs or muscle relaxers. We also added Neurontin, Celebrex which has not provided sufficient relief. I think he is a candidate now for cervical epidural, he would like to stay in [place] so we will work with Dr. Laurian Brim. 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  Lumbar spondylosis I did confirm severe spinal stenosis L4-L5. As above with his cervical spine conservative treatment has not been effective so we will proceed with a lumbar epidural with Dr. Laurian Brim. 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  Depression due to physical illness Komm depression is worsening, continued anhedonia, irritability, frustration, has now lost over 10 pounds. At this point he is agreeable to starting antidepressant at least for short-term, starting Cymbalta. He will discontinue gabapentin for now as it was not effective.  Annual physical exam Desires to switch to me for PCP, I am his wife's PCP, approved.    ____________________________________________ Ihor Austin. Benjamin Stain, M.D., ABFM., CAQSM., AME. Primary Care and Sports Medicine [facility]  Adjunct Professor of Family Medicine  [place] [employer] [facility] [employer]  Restaurant manager, fast food

## 2022-12-08 DIAGNOSIS — M47812 Spondylosis without myelopathy or radiculopathy, cervical region: Secondary | ICD-10-CM | POA: Diagnosis not present

## 2022-12-08 DIAGNOSIS — M5136 Other intervertebral disc degeneration, lumbar region: Secondary | ICD-10-CM | POA: Diagnosis not present

## 2022-12-08 DIAGNOSIS — M4802 Spinal stenosis, cervical region: Secondary | ICD-10-CM | POA: Diagnosis not present

## 2022-12-08 DIAGNOSIS — M5030 Other cervical disc degeneration, unspecified cervical region: Secondary | ICD-10-CM | POA: Diagnosis not present

## 2022-12-08 DIAGNOSIS — G894 Chronic pain syndrome: Secondary | ICD-10-CM | POA: Diagnosis not present

## 2022-12-08 DIAGNOSIS — M4722 Other spondylosis with radiculopathy, cervical region: Secondary | ICD-10-CM | POA: Diagnosis not present

## 2022-12-08 DIAGNOSIS — M48061 Spinal stenosis, lumbar region without neurogenic claudication: Secondary | ICD-10-CM | POA: Diagnosis not present

## 2022-12-08 DIAGNOSIS — M47816 Spondylosis without myelopathy or radiculopathy, lumbar region: Secondary | ICD-10-CM | POA: Diagnosis not present

## 2023-01-07 DIAGNOSIS — M4722 Other spondylosis with radiculopathy, cervical region: Secondary | ICD-10-CM | POA: Diagnosis not present

## 2023-01-07 DIAGNOSIS — M47813 Spondylosis without myelopathy or radiculopathy, cervicothoracic region: Secondary | ICD-10-CM | POA: Diagnosis not present

## 2023-01-07 DIAGNOSIS — M47812 Spondylosis without myelopathy or radiculopathy, cervical region: Secondary | ICD-10-CM | POA: Diagnosis not present

## 2023-01-07 DIAGNOSIS — M4802 Spinal stenosis, cervical region: Secondary | ICD-10-CM | POA: Diagnosis not present

## 2023-01-07 DIAGNOSIS — M50821 Other cervical disc disorders at C4-C5 level: Secondary | ICD-10-CM | POA: Diagnosis not present

## 2023-01-13 DIAGNOSIS — M47812 Spondylosis without myelopathy or radiculopathy, cervical region: Secondary | ICD-10-CM | POA: Diagnosis not present

## 2023-01-13 DIAGNOSIS — M5136 Other intervertebral disc degeneration, lumbar region: Secondary | ICD-10-CM | POA: Diagnosis not present

## 2023-01-13 DIAGNOSIS — M5030 Other cervical disc degeneration, unspecified cervical region: Secondary | ICD-10-CM | POA: Diagnosis not present

## 2023-01-13 DIAGNOSIS — M48061 Spinal stenosis, lumbar region without neurogenic claudication: Secondary | ICD-10-CM | POA: Diagnosis not present

## 2023-01-13 DIAGNOSIS — M4802 Spinal stenosis, cervical region: Secondary | ICD-10-CM | POA: Diagnosis not present

## 2023-01-13 DIAGNOSIS — G894 Chronic pain syndrome: Secondary | ICD-10-CM | POA: Diagnosis not present

## 2023-01-13 DIAGNOSIS — M47816 Spondylosis without myelopathy or radiculopathy, lumbar region: Secondary | ICD-10-CM | POA: Diagnosis not present

## 2023-01-13 HISTORY — PX: OTHER SURGICAL HISTORY: SHX169

## 2023-02-20 DIAGNOSIS — M47812 Spondylosis without myelopathy or radiculopathy, cervical region: Secondary | ICD-10-CM | POA: Diagnosis not present

## 2023-02-20 DIAGNOSIS — G894 Chronic pain syndrome: Secondary | ICD-10-CM | POA: Diagnosis not present

## 2023-02-20 DIAGNOSIS — M4802 Spinal stenosis, cervical region: Secondary | ICD-10-CM | POA: Diagnosis not present

## 2023-02-20 DIAGNOSIS — M5030 Other cervical disc degeneration, unspecified cervical region: Secondary | ICD-10-CM | POA: Diagnosis not present

## 2023-02-20 DIAGNOSIS — M47816 Spondylosis without myelopathy or radiculopathy, lumbar region: Secondary | ICD-10-CM | POA: Diagnosis not present

## 2023-02-20 DIAGNOSIS — M5136 Other intervertebral disc degeneration, lumbar region: Secondary | ICD-10-CM | POA: Diagnosis not present

## 2023-02-20 DIAGNOSIS — M48061 Spinal stenosis, lumbar region without neurogenic claudication: Secondary | ICD-10-CM | POA: Diagnosis not present

## 2023-02-20 DIAGNOSIS — M4722 Other spondylosis with radiculopathy, cervical region: Secondary | ICD-10-CM | POA: Diagnosis not present

## 2023-03-24 DIAGNOSIS — M5013 Cervical disc disorder with radiculopathy, cervicothoracic region: Secondary | ICD-10-CM | POA: Diagnosis not present

## 2023-03-24 DIAGNOSIS — M4722 Other spondylosis with radiculopathy, cervical region: Secondary | ICD-10-CM | POA: Diagnosis not present

## 2023-03-24 DIAGNOSIS — M4802 Spinal stenosis, cervical region: Secondary | ICD-10-CM | POA: Diagnosis not present

## 2023-03-24 DIAGNOSIS — M4803 Spinal stenosis, cervicothoracic region: Secondary | ICD-10-CM | POA: Diagnosis not present

## 2023-03-24 DIAGNOSIS — M5033 Other cervical disc degeneration, cervicothoracic region: Secondary | ICD-10-CM | POA: Diagnosis not present

## 2023-03-24 DIAGNOSIS — G894 Chronic pain syndrome: Secondary | ICD-10-CM | POA: Diagnosis not present

## 2023-03-24 HISTORY — PX: OTHER SURGICAL HISTORY: SHX169

## 2023-03-29 ENCOUNTER — Other Ambulatory Visit: Payer: Self-pay | Admitting: Sports Medicine

## 2023-03-29 DIAGNOSIS — F0631 Mood disorder due to known physiological condition with depressive features: Secondary | ICD-10-CM

## 2023-03-31 ENCOUNTER — Ambulatory Visit (INDEPENDENT_AMBULATORY_CARE_PROVIDER_SITE_OTHER): Payer: Medicare HMO | Admitting: Family Medicine

## 2023-03-31 DIAGNOSIS — Z0000 Encounter for general adult medical examination without abnormal findings: Secondary | ICD-10-CM

## 2023-03-31 NOTE — [place] Notes (Signed)
MEDICARE ANNUAL WELLNESS VISIT  03/31/2023  Telephone Visit Disclaimer This Medicare AWV was conducted by telephone due to national recommendations for restrictions regarding the COVID-19 Pandemic (e.g. social distancing).  I verified, using two identifiers, that I am speaking with Joel Hopkins or their authorized healthcare agent. I discussed the limitations, risks, security, and privacy concerns of performing an evaluation and management service by telephone and the potential availability of an in-person appointment in the future. The patient expressed understanding and agreed to proceed.  Location of Patient: Home Location of Provider (nurse):  Provider home  Subjective:    Joel Hopkins is a 77 y.o. male patient of Everrett Coombe, DO who had a Medicare Annual Wellness Visit today via telephone. Eros is Retired and lives with their spouse. he has 3 children. he reports that he is socially active and does interact with friends/family regularly. he is moderately physically active and enjoys working on old cars.  Patient Care Team: Everrett Coombe, DO as PCP - General (Family Medicine)     03/31/2023    8:10 AM 10/21/2022    9:38 AM 03/26/2022    8:09 AM 10/29/2020    4:07 PM 10/24/2019    9:08 AM 10/18/2018    9:15 AM 06/10/2017    1:39 PM  Advanced Directives  Does Patient Have a Medical Advance Directive? Yes Yes Yes Yes Yes No Yes  Type of Advance Directive Living will Healthcare Power of [place];Living will Living will Healthcare Power of [place];Living will Healthcare Power of [place];Living will  Healthcare Power of [place];Living will  Does patient want to make changes to medical advance directive? No - Patient declined No - Patient declined No - Patient declined No - Patient declined No - Patient declined    Copy of Healthcare Power of Attorney in Chart?    No - copy requested No - copy requested  No - copy requested  Would patient like information on creating a  medical advance directive?    No - Patient declined  Yes (MAU/Ambulatory/Procedural Areas - Information given)     Hospital Utilization Over the Past 12 Months: # of hospitalizations or ER visits: 1 # of surgeries: 2  Review of Systems    Patient reports that his overall health is unchanged compared to last year.  History obtained from chart review, the patient, and his wife, Joel Hopkins.   Patient Reported Readings (BP, Pulse, CBG, Weight, etc) None  Pain Assessment Pain : 0-10 Pain Score: 6  Pain Type: Chronic pain Pain Location: Back Pain Descriptors / Indicators: Aching Pain Onset: More than a month ago Pain Frequency: Intermittent Pain Relieving Factors: rest  Pain Relieving Factors: rest  Current Medications & Allergies (verified) Allergies as of 03/31/2023   No Known Allergies      Medication List        Accurate as of March 31, 2023  8:27 AM. If you have any questions, ask your nurse or doctor.          albuterol 108 (90 Base) MCG/ACT inhaler Commonly known as: VENTOLIN HFA Inhale 2 puffs into the lungs every 6 (six) hours as needed.   albuterol (2.5 MG/3ML) 0.083% nebulizer solution Commonly known as: PROVENTIL Take 3 mLs (2.5 mg total) by nebulization every 6 (six) hours as needed for wheezing or shortness of breath.   benzonatate 200 MG capsule Commonly known as: TESSALON Take 1 capsule (200 mg total) by mouth 2 (two) times daily as needed for cough.  celecoxib 200 MG capsule Commonly known as: CeleBREX One to 2 tablets by mouth daily as needed for pain.   DULoxetine 30 MG capsule Commonly known as: Cymbalta Take 1 capsule (30 mg total) by mouth daily.   HYDROcodone-acetaminophen 5-325 MG tablet Commonly known as: NORCO/VICODIN Take 1 tablet by mouth every 8 (eight) hours as needed for moderate pain.   levothyroxine 88 MCG tablet Commonly known as: SYNTHROID TAKE ONE TABLET BY MOUTH EVERY MORNING BEFORE BREAKFAST   meclizine 25 MG  tablet Commonly known as: ANTIVERT Take 1 tablet (25 mg total) by mouth 3 (three) times daily as needed for dizziness.        History (reviewed): Past Medical History:  Diagnosis Date   Hypothyroid 09/21/2012   Past Surgical History:  Procedure Laterality Date   APPENDECTOMY     BACK SURGERY     Cervical Fusion C5-C7   BILATERAL LUMBAR L4-L5 TRANSFORAMINAL EPIDURAL STEROID INJECTION  01/13/2023   [facility] - care everywhere   CATARACT EXTRACTION     CERVICAL EPIDURAL STEROID INJECTION   03/24/2023   [facility] - care everywhere   HERNIA REPAIR     VASECTOMY     Family History  Problem Relation Age of Onset   Colon cancer Sister    Diabetes Brother    Heart disease Mother    Cancer Brother        brain cancer   Heart disease Brother    COPD Brother    Cancer Brother        lung cancer   Social History   Socioeconomic History   Marital status: Married    Spouse name: Joel Hopkins   Number of children: 3   Years of education: 12   Highest education level: 12th grade  Occupational History   Occupation: retired    Comment: truck Hospital doctor  Tobacco Use   Smoking status: Never   Smokeless tobacco: Never  Building services engineer Use: Never used  Substance and Sexual Activity   Alcohol use: Not Currently   Drug use: No   Sexual activity: Yes  Other Topics Concern   Not on file  Social History Narrative   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  He enjoys working on his old cars.   Social Determinants of Health   Financial Resource Strain: Low Risk  (03/31/2023)   Overall Financial Resource Strain (CARDIA)    Difficulty of Paying Living Expenses: Not hard at all  Food Insecurity: No Food Insecurity (03/31/2023)   Hunger Vital Sign    Worried About Running Out of Food in the Last Year: Never true    Ran Out of Food in the Last Year: Never true  Transportation Needs: No Transportation Needs (03/31/2023)   PRAPARE - Administrator, Civil Service (Medical):  No    Lack of Transportation (Non-Medical): No  Physical Activity: Insufficiently Active (03/31/2023)   Exercise Vital Sign    Days of Exercise per Week: 7 days    Minutes of Exercise per Session: 20 min  Stress: No Stress Concern Present (03/31/2023)   [employer] of Occupational Health - Occupational Stress Questionnaire    Feeling of Stress : Not at all  Social Connections: Socially Integrated (03/31/2023)   Social Connection and Isolation Panel [NHANES]    Frequency of Communication with Friends and Family: More than three times a week    Frequency of Social Gatherings with Friends and Family: More than three times a week  Attends Religious Services: More than 4 times per year    Active Member of Clubs or Organizations: Yes    Attends Banker Meetings: More than 4 times per year    Marital Status: Married    Activities of Daily Living    03/31/2023    8:15 AM  In your present state of health, do you have any difficulty performing the following activities:  Hearing? 1  Comment wears hearing aids  Vision? 0  Difficulty concentrating or making decisions? 0  Walking or climbing stairs? 0  Dressing or bathing? 0  Doing errands, shopping? 0  Preparing Food and eating ? N  Using the Toilet? N  In the past six months, have you accidently leaked urine? N  Do you have problems with loss of bowel control? N  Managing your Medications? N  Managing your Finances? N  Housekeeping or managing your Housekeeping? N    Patient Education/ Literacy How often do you need to have someone help you when you read instructions, pamphlets, or other written materials from your doctor or pharmacy?: 1 - Never What is the last grade level you completed in school?: 12th grade  Exercise    Diet Patient reports consuming 3 meals a day and 2 snack(s) a day Patient reports that his primary diet is: Regular Patient reports that she does have regular access to food.   Depression  Screen    03/31/2023    8:11 AM 03/26/2022    8:10 AM 11/04/2021   10:45 AM 10/29/2020    4:14 PM 10/24/2019    9:09 AM 10/18/2018    9:22 AM 01/25/2018    9:12 AM  PHQ 2/9 Scores  PHQ - 2 Score 0 0 0 0 0 0 0  PHQ- 9 Score    0   0     Fall Risk    03/31/2023    8:10 AM 03/26/2022    8:09 AM 11/04/2021   10:45 AM 10/29/2020    4:13 PM 10/24/2019    9:08 AM  Fall Risk   Falls in the past year? 0 1 0 1 0  Number falls in past yr: 0 0 0 0   Injury with Fall? 0 0 0 0 0  Risk for fall due to : No Fall Risks No Fall Risks No Fall Risks No Fall Risks No Fall Risks  Follow up Falls evaluation completed Falls evaluation completed Falls evaluation completed Falls evaluation completed;Education provided Falls prevention discussed     Objective:  Joel Hopkins seemed alert and oriented and he participated appropriately during our telephone visit.  Blood Pressure Weight BMI  BP Readings from Last 3 Encounters:  10/09/22 106/64  10/01/22 108/70  09/16/22 108/62   Wt Readings from Last 3 Encounters:  10/09/22 196 lb 7 oz (89.1 kg)  10/01/22 196 lb 2 oz (89 kg)  09/16/22 205 lb 1.9 oz (93 kg)   BMI Readings from Last 1 Encounters:  10/09/22 25.92 kg/m    *Unable to obtain current vital signs, weight, and BMI due to telephone visit type  Hearing/Vision  Farron did seem to have difficulty with hearing/understanding during the telephone conversation Reports that he has had a formal eye exam by an eye care professional within the past year Reports that he has had a formal hearing evaluation within the past year *Unable to fully assess hearing and vision during telephone visit type  Cognitive Function:    03/31/2023    8:17 AM  03/26/2022    8:16 AM 10/29/2020    4:17 PM 10/24/2019    9:12 AM 10/18/2018    9:29 AM  6CIT Screen  What Year? 0 points 0 points 0 points 0 points 0 points  What month? 0 points 0 points 0 points 0 points 0 points  What time? 0 points 0 points 0 points 0 points  0 points  Count back from 20 0 points 2 points 0 points 0 points 0 points  Months in reverse 2 points 2 points 2 points 0 points 0 points  Repeat phrase 2 points 0 points 0 points 0 points 0 points  Total Score 4 points 4 points 2 points 0 points 0 points   (Normal:0-7, Significant for Dysfunction: >8)  Normal Cognitive Function Screening: Yes   Immunization & Health Maintenance Record Immunization History  Administered Date(s) Administered   Fluad Quad(high Dose 65+) 07/06/2021   Influenza, High Dose Seasonal PF 08/04/2019   Influenza,inj,Quad PF,6+ Mos 06/30/2018   Influenza-Unspecified 06/15/2014, 08/07/2019, 07/20/2020   Janssen (J&J) SARS-COV-2 Vaccination 12/18/2019, 09/07/2020   Pneumococcal Conjugate-13 10/15/2015   Pneumococcal Polysaccharide-23 04/09/2017   Tdap 04/09/2017, 05/04/2018   Zoster Recombinat (Shingrix) 06/30/2018, 08/30/2018   Zoster, Live 01/30/2016    Health Maintenance  Topic Date Due   COVID-19 Vaccine (3 - 2023-24 season) 04/16/2023 (Originally 06/06/2022)   INFLUENZA VACCINE  05/07/2023   Colonoscopy  08/12/2023   Medicare Annual Wellness (AWV)  03/30/2024   DTaP/Tdap/Td (3 - Td or Tdap) 05/04/2028   Pneumonia Vaccine 55+ Years old  Completed   Hepatitis C Screening  Completed   Zoster Vaccines- Shingrix  Completed   HPV VACCINES  Aged Out       Assessment  This is a routine wellness examination for [facility].  Health Maintenance: Due or Overdue There are no preventive care reminders to display for this patient.   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       Personalized Health  Maintenance & Screening Recommendations  Colorectal cancer screening - due in November.  Lung Cancer Screening Recommended: no (Low Dose CT Chest recommended if Age 75-80 years, 20 pack-year currently smoking OR have quit w/in past 15 years) Hepatitis C Screening recommended: no HIV Screening recommended: no  Advanced Directives: Written information was not prepared per patient's request.  Referrals & Orders No orders of the defined types were placed in this encounter.   Follow-up Plan Follow-up with Everrett Coombe, DO as planned Please schedule colonoscopy in November. Let us know if we need to send the referral.  Medicare wellness visit in one year.  Patient will access AVS on my chart.   I have personally reviewed and noted the following in the patient's chart:   Medical and social history Use of alcohol, tobacco or illicit drugs  Current medications and supplements Functional ability and status Nutritional status Physical activity Advanced directives List of other physicians Hospitalizations, surgeries, and ER visits in previous 12 months Vitals Screenings to include cognitive, depression, and falls Referrals  and appointments  In addition, I have reviewed and discussed with Joel Hopkins certain preventive protocols, quality metrics, and best practice recommendations. A written personalized care plan for preventive services as well as general preventive health recommendations is available and can be mailed to the patient at his request.      Modesto Charon, RN BSN  03/31/2023

## 2023-03-31 NOTE — Patient Instructions (Signed)
MEDICARE ANNUAL WELLNESS VISIT Health Maintenance Summary and Written Plan of Care  [name] ,  Thank you for allowing me to perform your Medicare Annual Wellness Visit and for your ongoing commitment to your health.   Health Maintenance & Immunization History Health Maintenance  Topic Date Due   COVID-19 Vaccine (3 - 2023-24 season) 04/16/2023 (Originally 06/06/2022)   INFLUENZA VACCINE  05/07/2023   Colonoscopy  08/12/2023   Medicare Annual Wellness (AWV)  03/30/2024   DTaP/Tdap/Td (3 - Td or Tdap) 05/04/2028   Pneumonia Vaccine 18+ Years old  Completed   Hepatitis C Screening  Completed   Zoster Vaccines- Shingrix  Completed   HPV VACCINES  Aged Out   Immunization History  Administered Date(s) Administered   Fluad Quad(high Dose 65+) 07/06/2021   Influenza, High Dose Seasonal PF 08/04/2019   Influenza,inj,Quad PF,6+ Mos 06/30/2018   Influenza-Unspecified 06/15/2014, 08/07/2019, 07/20/2020   Janssen (J&J) SARS-COV-2 Vaccination 12/18/2019, 09/07/2020   Pneumococcal Conjugate-13 10/15/2015   Pneumococcal Polysaccharide-23 04/09/2017   Tdap 04/09/2017, 05/04/2018   Zoster Recombinat (Shingrix) 06/30/2018, 08/30/2018   Zoster, Live 01/30/2016    These are the patient goals that we discussed:  Goals Addressed               This Visit's Progress     Patient Stated (pt-stated)        Patient stated that he would like to get better and 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         This is a list of Health Maintenance Items that are overdue or due now: Colorectal cancer screening - due in November.  Orders/Referrals Placed Today: No orders of the defined types were placed in this encounter.  (Contact our referral department at [phone] if you have not spoken with someone about your referral appointment within the next 5 days)    Follow-up Plan Follow-up with Everrett Coombe, DO as planned Please schedule colonoscopy in November. Let us know if we need to  send the referral.  Medicare wellness visit in one year.  Patient will access AVS on my chart.      Health Maintenance, Male Adopting a healthy lifestyle and getting preventive care are important in promoting health and wellness. Ask your health care provider about: The right schedule for you to have regular tests and exams. Things you can do on your own to prevent diseases and keep yourself healthy. What should I know about diet, weight, and exercise? Eat a healthy diet  Eat a diet that includes plenty of vegetables, fruits, low-fat dairy products, and lean protein. Do not eat a lot of foods that are high in solid fats, added sugars, or sodium. Maintain a healthy weight Body mass index (BMI) is a measurement that can be used to identify possible weight problems. It estimates body fat based on height and weight. Your health care provider can help determine your BMI and help you achieve or maintain a healthy weight. Get regular exercise Get regular exercise. This is one of the most important things you can do for your health. Most adults should: Exercise for at least 150 minutes each week. The exercise should increase your heart rate and make you sweat (moderate-intensity exercise). Do strengthening exercises at least twice a week. This is in addition to the moderate-intensity exercise. Spend less time sitting. Even light physical activity can be beneficial. Watch cholesterol and blood lipids Have your blood tested for lipids and cholesterol at 76 years of age, then have  this test every 5 years. You may need to have your cholesterol levels checked more often if: Your lipid or cholesterol levels are high. You are older than 76 years of age. You are at high risk for heart disease. What should I know about cancer screening? Many types of cancers can be detected early and may often be prevented. Depending on your health history and family history, you may need to have cancer screening at  various ages. This may include screening for: Colorectal cancer. Prostate cancer. Skin cancer. Lung cancer. What should I know about heart disease, diabetes, and high blood pressure? Blood pressure and heart disease High blood pressure causes heart disease and increases the risk of stroke. This is more likely to develop in people who have high blood pressure readings or are overweight. Talk with your health care provider about your target blood pressure readings. Have your blood pressure checked: Every 3-5 years if you are 88-67 years of age. Every year if you are 61 years old or older. If you are between the ages of 70 and 41 and are a current or former smoker, ask your health care provider if you should have a one-time screening for abdominal aortic aneurysm (AAA). Diabetes Have regular diabetes screenings. This checks your fasting blood sugar level. Have the screening done: Once every three years after age 6 if you are at a normal weight and have a low risk for diabetes. More often and at a younger age if you are overweight or have a high risk for diabetes. What should I know about preventing infection? Hepatitis B If you have a higher risk for hepatitis B, you should be screened for this virus. Talk with your health care provider to find out if you are at risk for hepatitis B infection. Hepatitis C Blood testing is recommended for: Everyone born from [birth year] through 1964. Anyone with known risk factors for hepatitis C. Sexually transmitted infections (STIs) You should be screened each year for STIs, including gonorrhea and chlamydia, if: You are sexually active and are younger than 76 years of age. You are older than 76 years of age and your health care provider tells you that you are at risk for this type of infection. Your sexual activity has changed since you were last screened, and you are at increased risk for chlamydia or gonorrhea. Ask your health care provider if you are at  risk. Ask your health care provider about whether you are at high risk for HIV. Your health care provider may recommend a prescription medicine to help prevent HIV infection. If you choose to take medicine to prevent HIV, you should first get tested for HIV. You should then be tested every 3 months for as long as you are taking the medicine. Follow these instructions at home: Alcohol use Do not drink alcohol if your health care provider tells you not to drink. If you drink alcohol: Limit how much you have to 0-2 drinks a day. Know how much alcohol is in your drink. In the [place] one drink equals one 12 oz bottle of beer (355 mL), one 5 oz glass of wine (148 mL), or one 1 oz glass of hard liquor (44 mL). Lifestyle Do not use any products that contain nicotine or tobacco. These products include cigarettes, chewing tobacco, and vaping devices, such as e-cigarettes. If you need help quitting, ask your health care provider. Do not use street drugs. Do not share needles. Ask your health care provider for help if you  need support or information about quitting drugs. General instructions Schedule regular health, dental, and eye exams. Stay current with your vaccines. Tell your health care provider if: You often feel depressed. You have ever been abused or do not feel safe at home. Summary Adopting a healthy lifestyle and getting preventive care are important in promoting health and wellness. Follow your health care provider's instructions about healthy diet, exercising, and getting tested or screened for diseases. Follow your health care provider's instructions on monitoring your cholesterol and blood pressure. This information is not intended to replace advice given to you by your health care provider. Make sure you discuss any questions you have with your health care provider. Document Revised: 02/11/2021 Document Reviewed: 02/11/2021 Elsevier Patient Education  2024 [employer].

## 2023-04-06 DIAGNOSIS — H906 Mixed conductive and sensorineural hearing loss, bilateral: Secondary | ICD-10-CM | POA: Diagnosis not present

## 2023-04-15 DIAGNOSIS — H906 Mixed conductive and sensorineural hearing loss, bilateral: Secondary | ICD-10-CM | POA: Diagnosis not present

## 2023-04-21 DIAGNOSIS — M4802 Spinal stenosis, cervical region: Secondary | ICD-10-CM | POA: Diagnosis not present

## 2023-04-21 DIAGNOSIS — M48061 Spinal stenosis, lumbar region without neurogenic claudication: Secondary | ICD-10-CM | POA: Diagnosis not present

## 2023-04-21 DIAGNOSIS — G894 Chronic pain syndrome: Secondary | ICD-10-CM | POA: Diagnosis not present

## 2023-04-21 DIAGNOSIS — M5136 Other intervertebral disc degeneration, lumbar region: Secondary | ICD-10-CM | POA: Diagnosis not present

## 2023-04-21 DIAGNOSIS — M5030 Other cervical disc degeneration, unspecified cervical region: Secondary | ICD-10-CM | POA: Diagnosis not present

## 2023-05-05 DIAGNOSIS — M5030 Other cervical disc degeneration, unspecified cervical region: Secondary | ICD-10-CM | POA: Diagnosis not present

## 2023-05-05 DIAGNOSIS — M47816 Spondylosis without myelopathy or radiculopathy, lumbar region: Secondary | ICD-10-CM | POA: Diagnosis not present

## 2023-05-05 DIAGNOSIS — M5136 Other intervertebral disc degeneration, lumbar region: Secondary | ICD-10-CM | POA: Diagnosis not present

## 2023-05-05 DIAGNOSIS — M48061 Spinal stenosis, lumbar region without neurogenic claudication: Secondary | ICD-10-CM | POA: Diagnosis not present

## 2023-05-05 DIAGNOSIS — M47812 Spondylosis without myelopathy or radiculopathy, cervical region: Secondary | ICD-10-CM | POA: Diagnosis not present

## 2023-05-05 DIAGNOSIS — G894 Chronic pain syndrome: Secondary | ICD-10-CM | POA: Diagnosis not present

## 2023-05-05 DIAGNOSIS — M4802 Spinal stenosis, cervical region: Secondary | ICD-10-CM | POA: Diagnosis not present

## 2023-05-05 DIAGNOSIS — M4722 Other spondylosis with radiculopathy, cervical region: Secondary | ICD-10-CM | POA: Diagnosis not present

## 2023-05-29 DIAGNOSIS — M47812 Spondylosis without myelopathy or radiculopathy, cervical region: Secondary | ICD-10-CM | POA: Diagnosis not present

## 2023-05-29 DIAGNOSIS — M4802 Spinal stenosis, cervical region: Secondary | ICD-10-CM | POA: Diagnosis not present

## 2023-05-29 DIAGNOSIS — M5412 Radiculopathy, cervical region: Secondary | ICD-10-CM | POA: Diagnosis not present

## 2023-05-29 DIAGNOSIS — M5030 Other cervical disc degeneration, unspecified cervical region: Secondary | ICD-10-CM | POA: Diagnosis not present

## 2023-06-01 DIAGNOSIS — M4722 Other spondylosis with radiculopathy, cervical region: Secondary | ICD-10-CM | POA: Diagnosis not present

## 2023-06-01 DIAGNOSIS — M48061 Spinal stenosis, lumbar region without neurogenic claudication: Secondary | ICD-10-CM | POA: Diagnosis not present

## 2023-06-01 DIAGNOSIS — M47816 Spondylosis without myelopathy or radiculopathy, lumbar region: Secondary | ICD-10-CM | POA: Diagnosis not present

## 2023-06-01 DIAGNOSIS — M5010 Cervical disc disorder with radiculopathy, unspecified cervical region: Secondary | ICD-10-CM | POA: Diagnosis not present

## 2023-06-01 DIAGNOSIS — M5136 Other intervertebral disc degeneration, lumbar region: Secondary | ICD-10-CM | POA: Diagnosis not present

## 2023-06-01 DIAGNOSIS — G894 Chronic pain syndrome: Secondary | ICD-10-CM | POA: Diagnosis not present

## 2023-06-01 DIAGNOSIS — M4802 Spinal stenosis, cervical region: Secondary | ICD-10-CM | POA: Diagnosis not present

## 2023-06-03 DIAGNOSIS — D3131 Benign neoplasm of right choroid: Secondary | ICD-10-CM | POA: Diagnosis not present

## 2023-06-03 DIAGNOSIS — Z961 Presence of intraocular lens: Secondary | ICD-10-CM | POA: Diagnosis not present

## 2023-06-03 DIAGNOSIS — H43813 Vitreous degeneration, bilateral: Secondary | ICD-10-CM | POA: Diagnosis not present

## 2023-06-03 DIAGNOSIS — H524 Presbyopia: Secondary | ICD-10-CM | POA: Diagnosis not present

## 2023-06-11 DIAGNOSIS — Z129 Encounter for screening for malignant neoplasm, site unspecified: Secondary | ICD-10-CM | POA: Diagnosis not present

## 2023-06-11 DIAGNOSIS — D692 Other nonthrombocytopenic purpura: Secondary | ICD-10-CM | POA: Diagnosis not present

## 2023-06-11 DIAGNOSIS — L570 Actinic keratosis: Secondary | ICD-10-CM | POA: Diagnosis not present

## 2023-06-11 DIAGNOSIS — Z86008 Personal history of in-situ neoplasm of other site: Secondary | ICD-10-CM | POA: Diagnosis not present

## 2023-06-11 DIAGNOSIS — Z85820 Personal history of malignant melanoma of skin: Secondary | ICD-10-CM | POA: Diagnosis not present

## 2023-06-23 DIAGNOSIS — M4726 Other spondylosis with radiculopathy, lumbar region: Secondary | ICD-10-CM | POA: Diagnosis not present

## 2023-06-23 DIAGNOSIS — M4802 Spinal stenosis, cervical region: Secondary | ICD-10-CM | POA: Diagnosis not present

## 2023-06-23 DIAGNOSIS — M4722 Other spondylosis with radiculopathy, cervical region: Secondary | ICD-10-CM | POA: Diagnosis not present

## 2023-06-23 DIAGNOSIS — M47816 Spondylosis without myelopathy or radiculopathy, lumbar region: Secondary | ICD-10-CM | POA: Diagnosis not present

## 2023-06-23 DIAGNOSIS — M47812 Spondylosis without myelopathy or radiculopathy, cervical region: Secondary | ICD-10-CM | POA: Diagnosis not present

## 2023-06-23 DIAGNOSIS — M5136 Other intervertebral disc degeneration, lumbar region: Secondary | ICD-10-CM | POA: Diagnosis not present

## 2023-06-23 DIAGNOSIS — M5030 Other cervical disc degeneration, unspecified cervical region: Secondary | ICD-10-CM | POA: Diagnosis not present

## 2023-07-07 DIAGNOSIS — G894 Chronic pain syndrome: Secondary | ICD-10-CM | POA: Diagnosis not present

## 2023-07-07 DIAGNOSIS — M4726 Other spondylosis with radiculopathy, lumbar region: Secondary | ICD-10-CM | POA: Diagnosis not present

## 2023-07-07 DIAGNOSIS — M4802 Spinal stenosis, cervical region: Secondary | ICD-10-CM | POA: Diagnosis not present

## 2023-07-07 DIAGNOSIS — M48061 Spinal stenosis, lumbar region without neurogenic claudication: Secondary | ICD-10-CM | POA: Diagnosis not present

## 2023-07-07 DIAGNOSIS — M47816 Spondylosis without myelopathy or radiculopathy, lumbar region: Secondary | ICD-10-CM | POA: Diagnosis not present

## 2023-08-03 DIAGNOSIS — G894 Chronic pain syndrome: Secondary | ICD-10-CM | POA: Diagnosis not present

## 2023-08-03 DIAGNOSIS — M47812 Spondylosis without myelopathy or radiculopathy, cervical region: Secondary | ICD-10-CM | POA: Diagnosis not present

## 2023-08-03 DIAGNOSIS — M5030 Other cervical disc degeneration, unspecified cervical region: Secondary | ICD-10-CM | POA: Diagnosis not present

## 2023-08-03 DIAGNOSIS — M4802 Spinal stenosis, cervical region: Secondary | ICD-10-CM | POA: Diagnosis not present

## 2023-08-03 DIAGNOSIS — M48061 Spinal stenosis, lumbar region without neurogenic claudication: Secondary | ICD-10-CM | POA: Diagnosis not present

## 2023-08-03 DIAGNOSIS — M47816 Spondylosis without myelopathy or radiculopathy, lumbar region: Secondary | ICD-10-CM | POA: Diagnosis not present

## 2023-08-03 DIAGNOSIS — M51369 Other intervertebral disc degeneration, lumbar region without mention of lumbar back pain or lower extremity pain: Secondary | ICD-10-CM | POA: Diagnosis not present

## 2023-08-17 DIAGNOSIS — M4802 Spinal stenosis, cervical region: Secondary | ICD-10-CM | POA: Diagnosis not present

## 2023-08-17 DIAGNOSIS — M47816 Spondylosis without myelopathy or radiculopathy, lumbar region: Secondary | ICD-10-CM | POA: Diagnosis not present

## 2023-08-17 DIAGNOSIS — M48061 Spinal stenosis, lumbar region without neurogenic claudication: Secondary | ICD-10-CM | POA: Diagnosis not present

## 2023-08-17 DIAGNOSIS — G894 Chronic pain syndrome: Secondary | ICD-10-CM | POA: Diagnosis not present

## 2023-08-17 DIAGNOSIS — M4722 Other spondylosis with radiculopathy, cervical region: Secondary | ICD-10-CM | POA: Diagnosis not present

## 2023-09-07 DIAGNOSIS — Z79899 Other long term (current) drug therapy: Secondary | ICD-10-CM | POA: Diagnosis not present

## 2023-09-07 DIAGNOSIS — M51369 Other intervertebral disc degeneration, lumbar region without mention of lumbar back pain or lower extremity pain: Secondary | ICD-10-CM | POA: Diagnosis not present

## 2023-09-07 DIAGNOSIS — M47812 Spondylosis without myelopathy or radiculopathy, cervical region: Secondary | ICD-10-CM | POA: Diagnosis not present

## 2023-09-07 DIAGNOSIS — M4802 Spinal stenosis, cervical region: Secondary | ICD-10-CM | POA: Diagnosis not present

## 2023-09-07 DIAGNOSIS — G8929 Other chronic pain: Secondary | ICD-10-CM | POA: Diagnosis not present

## 2023-09-07 DIAGNOSIS — M5030 Other cervical disc degeneration, unspecified cervical region: Secondary | ICD-10-CM | POA: Diagnosis not present

## 2023-09-07 DIAGNOSIS — M5412 Radiculopathy, cervical region: Secondary | ICD-10-CM | POA: Diagnosis not present

## 2023-09-15 DIAGNOSIS — L820 Inflamed seborrheic keratosis: Secondary | ICD-10-CM | POA: Diagnosis not present

## 2023-10-14 DIAGNOSIS — R202 Paresthesia of skin: Secondary | ICD-10-CM | POA: Diagnosis not present

## 2023-10-14 DIAGNOSIS — M48061 Spinal stenosis, lumbar region without neurogenic claudication: Secondary | ICD-10-CM | POA: Diagnosis not present

## 2023-10-14 DIAGNOSIS — M47816 Spondylosis without myelopathy or radiculopathy, lumbar region: Secondary | ICD-10-CM | POA: Diagnosis not present

## 2023-10-20 ENCOUNTER — Other Ambulatory Visit: Payer: Self-pay | Admitting: Family Medicine

## 2023-10-23 ENCOUNTER — Telehealth: Payer: Self-pay

## 2023-10-23 ENCOUNTER — Encounter: Payer: Self-pay | Admitting: Family Medicine

## 2023-10-23 ENCOUNTER — Ambulatory Visit (INDEPENDENT_AMBULATORY_CARE_PROVIDER_SITE_OTHER): Payer: Medicare HMO | Admitting: Family Medicine

## 2023-10-23 VITALS — BP 105/63 | HR 93 | Ht 73.0 in | Wt 201.2 lb

## 2023-10-23 DIAGNOSIS — J329 Chronic sinusitis, unspecified: Secondary | ICD-10-CM

## 2023-10-23 DIAGNOSIS — J40 Bronchitis, not specified as acute or chronic: Secondary | ICD-10-CM

## 2023-10-23 MED ORDER — GUAIFENESIN-CODEINE 100-10 MG/5ML PO SOLN: 5.0000 mL | Freq: Three times a day (TID) | ORAL | 0 refills | Status: DC | PRN

## 2023-10-23 MED ORDER — ALBUTEROL SULFATE HFA 108 (90 BASE) MCG/ACT IN AERS: 2.0000 | INHALATION_SPRAY | Freq: Four times a day (QID) | RESPIRATORY_TRACT | 0 refills | Status: DC | PRN

## 2023-10-23 MED ORDER — METHYLPREDNISOLONE 4 MG PO TBPK: ORAL_TABLET | ORAL | 0 refills | Status: DC

## 2023-10-23 MED ORDER — AMOXICILLIN-POT CLAVULANATE 875-125 MG PO TABS: 1.0000 | ORAL_TABLET | Freq: Two times a day (BID) | ORAL | 0 refills | Status: AC

## 2023-10-23 NOTE — Assessment & Plan Note (Signed)
Pt notes one week of cough and congestion associated with headaches. Lung exam normal. Likely upper respiratory still but will cover for pna with augmentin - albuterol inhaler given for tight cough heard on exam. Also given medrol dose pack and cough syrup-pmp reviewed and verified with no red flags.

## 2023-10-23 NOTE — Telephone Encounter (Signed)
Joel Hopkins,  Is this something you can assist the patient with as Dr. Ashley Royalty is not in the office this afternoon.

## 2023-10-23 NOTE — Telephone Encounter (Signed)
Copied from CRM [id number]. Topic: Clinical - Prescription Issue >> Oct 23, 2023 11:49 AM Nila Nephew wrote: Reason for CRM: Codeine Prescribed today needs to be sent to [facility], as [facility] will not have it until Monday at earliest.

## 2023-10-23 NOTE — Addendum Note (Signed)
Addended byChristen Butter on: 10/23/2023 04:56 PM   Modules accepted: Orders

## 2023-10-23 NOTE — Progress Notes (Signed)
Acute Office Visit  Subjective:     Patient ID: Joel Hopkins, male    DOB: 1946/04/25, 78 y.o.   MRN: [id number]  Chief Complaint  Patient presents with   Nasal Congestion    Right side headache, and chest congestion x3days    HPI Patient is in today for concerns of headache. Wife present on exam today and aids in history telling. Pt says for over a week he has been having congestion and cough. Notes that he is also having a lot of pressure near his head causing headaches.   Review of Systems  Constitutional:  Negative for chills and fever.  HENT:  Positive for congestion.   Respiratory:  Positive for cough. Negative for shortness of breath.   Cardiovascular:  Negative for chest pain.  Neurological:  Negative for headaches.        Objective:    BP 105/63 (BP Location: Left Arm, Patient Position: Sitting, Cuff Size: Large)   Pulse 93   Ht 6\' 1"  (1.854 m)   Wt 201 lb 4 oz (91.3 kg)   SpO2 97%   BMI 26.55 kg/m    Physical Exam Vitals and nursing note reviewed.  Constitutional:      General: He is not in acute distress.    Appearance: Normal appearance.  HENT:     Head: Normocephalic and atraumatic.     Right Ear: Tympanic membrane, ear canal and external ear normal.     Left Ear: Tympanic membrane, ear canal and external ear normal.     Ears:     Comments: Uses hearing aids    Nose: Congestion present.  Eyes:     Conjunctiva/sclera: Conjunctivae normal.  Cardiovascular:     Rate and Rhythm: Normal rate and regular rhythm.  Pulmonary:     Effort: Pulmonary effort is normal.     Breath sounds: Normal breath sounds.  Neurological:     General: No focal deficit present.     Mental Status: He is alert and oriented to person, place, and time.  Psychiatric:        Mood and Affect: Mood normal.        Behavior: Behavior normal.        Thought Content: Thought content normal.        Judgment: Judgment normal.     No results found for any visits on  10/23/23.      Assessment & Plan:   Problem List Items Addressed This Visit       Respiratory   Sinobronchitis - Primary   Pt notes one week of cough and congestion associated with headaches. Lung exam normal. Likely upper respiratory still but will cover for pna with augmentin - albuterol inhaler given for tight cough heard on exam. Also given medrol dose pack and cough syrup-pmp reviewed and verified with no red flags.      Relevant Medications   amoxicillin-clavulanate (AUGMENTIN) 875-125 MG tablet   methylPREDNISolone (MEDROL DOSEPAK) 4 MG TBPK tablet   albuterol (VENTOLIN HFA) 108 (90 Base) MCG/ACT inhaler   guaiFENesin-codeine 100-10 MG/5ML syrup    Meds ordered this encounter  Medications   amoxicillin-clavulanate (AUGMENTIN) 875-125 MG tablet    Sig: Take 1 tablet by mouth 2 (two) times daily for 5 days.    Dispense:  10 tablet    Refill:  0   methylPREDNISolone (MEDROL DOSEPAK) 4 MG TBPK tablet    Sig: Follow instructions on pill pack    Dispense:  21 tablet  Refill:  0   albuterol (VENTOLIN HFA) 108 (90 Base) MCG/ACT inhaler    Sig: Inhale 2 puffs into the lungs every 6 (six) hours as needed for wheezing or shortness of breath.    Dispense:  8 g    Refill:  0   guaiFENesin-codeine 100-10 MG/5ML syrup    Sig: Take 5 mLs by mouth 3 (three) times daily as needed for cough.    Dispense:  240 mL    Refill:  0    Return if symptoms worsen or fail to improve with PCP.  Charlton Amor, DO

## 2023-10-26 ENCOUNTER — Telehealth: Payer: Self-pay

## 2023-10-26 DIAGNOSIS — Z0000 Encounter for general adult medical examination without abnormal findings: Secondary | ICD-10-CM | POA: Insufficient documentation

## 2023-10-26 NOTE — Telephone Encounter (Signed)
Issue has been resolved.

## 2023-10-26 NOTE — Telephone Encounter (Signed)
Patient's wife stated they received a call & wanted to know what it was for. Checked notes & advised nurse was calling in regards to prescription issue. She stated that they now had it taken care of & she went & picked it up on Friday & patient is now taking the medication.

## 2023-10-26 NOTE — Telephone Encounter (Signed)
Attempted call to patient. Left a voice mail message requesting a return call.  

## 2023-10-26 NOTE — Telephone Encounter (Signed)
Copied from CRM [id number]. Topic: General - Other >> Oct 26, 2023 12:08 PM Prudencio Pair wrote: Reason for CRM: Patient's wife stated they received a call & wanted to know what it was for. Checked notes & advised nurse was calling in regards to prescription issue. She stated that they now had it taken care of & she went & picked it up on Friday & patient is now taking the medication.

## 2023-10-26 NOTE — Telephone Encounter (Signed)
Copied from CRM [id number]. Topic: General - Other >> Oct 23, 2023  4:29 PM Nila Nephew wrote: Reason for CRM: Patient calling to inquire about prescription status for patient. Patient doe not want it filled it it will be Monday before patient can get it.

## 2023-10-26 NOTE — Assessment & Plan Note (Signed)
Desires to switch to me for PCP, I am his wife's PCP, approved.

## 2023-10-29 ENCOUNTER — Encounter: Payer: Self-pay | Admitting: Neurology

## 2023-10-29 ENCOUNTER — Other Ambulatory Visit: Payer: Self-pay

## 2023-10-29 ENCOUNTER — Encounter: Payer: Self-pay | Admitting: Sports Medicine

## 2023-10-29 ENCOUNTER — Ambulatory Visit (INDEPENDENT_AMBULATORY_CARE_PROVIDER_SITE_OTHER): Payer: Medicare HMO | Admitting: Sports Medicine

## 2023-10-29 VITALS — BP 119/70 | HR 76 | Ht 73.0 in | Wt 201.0 lb

## 2023-10-29 DIAGNOSIS — E785 Hyperlipidemia, unspecified: Secondary | ICD-10-CM

## 2023-10-29 DIAGNOSIS — R1013 Epigastric pain: Secondary | ICD-10-CM

## 2023-10-29 DIAGNOSIS — M47816 Spondylosis without myelopathy or radiculopathy, lumbar region: Secondary | ICD-10-CM

## 2023-10-29 DIAGNOSIS — M5412 Radiculopathy, cervical region: Secondary | ICD-10-CM | POA: Diagnosis not present

## 2023-10-29 DIAGNOSIS — R202 Paresthesia of skin: Secondary | ICD-10-CM

## 2023-10-29 DIAGNOSIS — M7671 Peroneal tendinitis, right leg: Secondary | ICD-10-CM | POA: Diagnosis not present

## 2023-10-29 MED ORDER — PANTOPRAZOLE SODIUM 40 MG PO TBEC: 40.0000 mg | DELAYED_RELEASE_TABLET | Freq: Every day | ORAL | 3 refills | Status: DC

## 2023-10-29 NOTE — Assessment & Plan Note (Signed)
Also with chronic axial neck pain, he has been through multiple injections in the forms of epidurals, facet RFAs. Multiple medications were not effective. He has also been working Dr. Bess Harvest and Dr. Laurian Brim and his team. At this point sounds like they are requesting nerve conductions and EMGs, we will set this up. He will also do some additional physical therapy. We will for the most part stay hands off with regards to his spine treatment as I do not have much else to offer as a sports provider.

## 2023-10-29 NOTE — Assessment & Plan Note (Signed)
Also with significant mid epigastric burning and reflux symptoms. We will discuss dietary changes as well as add pantoprazole. He will also do H. pylori breath test today. Return to see me in 6 weeks for this.

## 2023-10-29 NOTE — Progress Notes (Signed)
    Procedures performed today:    None.  Independent interpretation of notes and tests performed by another provider:   None.  Brief History, Exam, Impression, and Recommendations:    Lumbar spondylosis Mykai has severe lumbar spinal stenosis at L4-L5, he has severe axial low back pain, weakness in his legs. He has worked with Dr. Laurian Brim and his team extensively with epidurals and facet blocks, ablations. L4-L5 transforaminal epidurals provided extremely good relief but only temporarily. He is on multiple medications including gabapentin, Cymbalta, nothing really seems to be helping. At this point I do think he is a candidate for operative intervention, I suspect posterior fusion. He has been in touch with Dr. Rockne Menghini. She had suggested additional physical therapy, I think both for the neck and the low back. He has declined any other medications. We will set him up for physical therapy for cervical and lumbar spondylosis, I will also set him up for bilateral upper and lower extremity nerve conduction and EMG to get him teed up for Dr. Petra Kuba.  Cervical radiculopathy Also with chronic axial neck pain, he has been through multiple injections in the forms of epidurals, facet RFAs. Multiple medications were not effective. He has also been working Dr. Bess Harvest and Dr. Laurian Brim and his team. At this point sounds like they are requesting nerve conductions and EMGs, we will set this up. He will also do some additional physical therapy. We will for the most part stay hands off with regards to his spine treatment as I do not have much else to offer as a sports provider.  Hyperlipidemia We have not checked routine labs in a while, ordering all the routine labs, he will come back fasting.  Dyspepsia Also with significant mid epigastric burning and reflux symptoms. We will discuss dietary changes as well as add pantoprazole. He will also do H. pylori breath test  today. Return to see me in 6 weeks for this.    ____________________________________________ Ihor Austin. Benjamin Stain, M.D., ABFM., CAQSM., AME. Primary Care and Sports Medicine [facility]  Adjunct Professor of Family Medicine  [employer] [place] [employer]  Restaurant manager, fast food

## 2023-10-29 NOTE — Assessment & Plan Note (Signed)
We have not checked routine labs in a while, ordering all the routine labs, he will come back fasting.

## 2023-10-29 NOTE — Assessment & Plan Note (Addendum)
Joel Hopkins has severe lumbar spinal stenosis at L4-L5, he has severe axial low back pain, weakness in his legs. He has worked with Dr. Laurian Brim and his team extensively with epidurals and facet blocks, ablations. L4-L5 transforaminal epidurals provided extremely good relief but only temporarily. He is on multiple medications including gabapentin, Cymbalta, nothing really seems to be helping. At this point I do think he is a candidate for operative intervention, I suspect posterior fusion. He has been in touch with Dr. Rockne Menghini. She had suggested additional physical therapy, I think both for the neck and the low back. He has declined any other medications. We will set him up for physical therapy for cervical and lumbar spondylosis, I will also set him up for bilateral upper and lower extremity nerve conduction and EMG to get him teed up for Dr. Petra Kuba.

## 2023-10-30 LAB — H. PYLORI BREATH TEST: H pylori Breath Test: NEGATIVE

## 2023-10-31 ENCOUNTER — Encounter: Payer: Self-pay | Admitting: Sports Medicine

## 2023-11-02 DIAGNOSIS — R7303 Prediabetes: Secondary | ICD-10-CM | POA: Diagnosis not present

## 2023-11-02 DIAGNOSIS — E785 Hyperlipidemia, unspecified: Secondary | ICD-10-CM | POA: Diagnosis not present

## 2023-11-03 LAB — COMPREHENSIVE METABOLIC PANEL
ALT: 11 [IU]/L (ref 0–44)
AST: 13 [IU]/L (ref 0–40)
Albumin: 4 g/dL (ref 3.8–4.8)
Alkaline Phosphatase: 64 [IU]/L (ref 44–121)
BUN/Creatinine Ratio: 16 (ref 10–24)
BUN: 23 mg/dL (ref 8–27)
Bilirubin Total: 0.3 mg/dL (ref 0.0–1.2)
CO2: 24 mmol/L (ref 20–29)
Calcium: 8.8 mg/dL (ref 8.6–10.2)
Chloride: 105 mmol/L (ref 96–106)
Creatinine, Ser: 1.45 mg/dL — ABNORMAL HIGH (ref 0.76–1.27)
Globulin, Total: 1.9 g/dL (ref 1.5–4.5)
Glucose: 102 mg/dL — ABNORMAL HIGH (ref 70–99)
Potassium: 5.1 mmol/L (ref 3.5–5.2)
Sodium: 142 mmol/L (ref 134–144)
Total Protein: 5.9 g/dL — ABNORMAL LOW (ref 6.0–8.5)
eGFR: 50 mL/min/{1.73_m2} — ABNORMAL LOW (ref >59)

## 2023-11-03 LAB — CBC
Hematocrit: 42.1 % (ref 37.5–51.0)
Hemoglobin: 13.8 g/dL (ref 13.0–17.7)
MCH: 30 pg (ref 26.6–33.0)
MCHC: 32.8 g/dL (ref 31.5–35.7)
MCV: 92 fL (ref 79–97)
RBC: 4.6 x10E6/uL (ref 4.14–5.80)
RDW: 12 % (ref 11.6–15.4)
WBC: 7.2 10*3/uL (ref 3.4–10.8)

## 2023-11-03 LAB — LIPID PANEL
Chol/HDL Ratio: 4.2 {ratio} (ref 0.0–5.0)
Cholesterol, Total: 186 mg/dL (ref 100–199)
HDL: 44 mg/dL (ref >39)
LDL Chol Calc (NIH): 129 mg/dL — ABNORMAL HIGH (ref 0–99)
Triglycerides: 72 mg/dL (ref 0–149)
VLDL Cholesterol Cal: 13 mg/dL (ref 5–40)

## 2023-11-03 LAB — HEMOGLOBIN A1C
Est. average glucose Bld gHb Est-mCnc: 117 mg/dL
Hgb A1c MFr Bld: 5.7 % — ABNORMAL HIGH (ref 4.8–5.6)

## 2023-11-03 LAB — TSH: TSH: 2.56 u[IU]/mL (ref 0.450–4.500)
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
## 2023-11-05 ENCOUNTER — Other Ambulatory Visit: Payer: Self-pay

## 2023-11-05 ENCOUNTER — Ambulatory Visit: Payer: Medicare HMO | Attending: Sports Medicine | Admitting: Physical Therapy

## 2023-11-05 DIAGNOSIS — M47816 Spondylosis without myelopathy or radiculopathy, lumbar region: Secondary | ICD-10-CM | POA: Diagnosis not present

## 2023-11-05 DIAGNOSIS — M5416 Radiculopathy, lumbar region: Secondary | ICD-10-CM | POA: Diagnosis present

## 2023-11-05 DIAGNOSIS — R252 Cramp and spasm: Secondary | ICD-10-CM | POA: Insufficient documentation

## 2023-11-05 DIAGNOSIS — M5459 Other low back pain: Secondary | ICD-10-CM | POA: Diagnosis present

## 2023-11-09 ENCOUNTER — Ambulatory Visit: Payer: Medicare HMO | Attending: Sports Medicine | Admitting: Physical Therapy

## 2023-11-09 ENCOUNTER — Encounter: Payer: Self-pay | Admitting: Physical Therapy

## 2023-11-09 DIAGNOSIS — M542 Cervicalgia: Secondary | ICD-10-CM | POA: Diagnosis present

## 2023-11-09 DIAGNOSIS — M6281 Muscle weakness (generalized): Secondary | ICD-10-CM | POA: Diagnosis present

## 2023-11-09 DIAGNOSIS — R252 Cramp and spasm: Secondary | ICD-10-CM | POA: Diagnosis present

## 2023-11-09 DIAGNOSIS — M5416 Radiculopathy, lumbar region: Secondary | ICD-10-CM | POA: Insufficient documentation

## 2023-11-09 DIAGNOSIS — M62830 Muscle spasm of back: Secondary | ICD-10-CM | POA: Insufficient documentation

## 2023-11-09 DIAGNOSIS — M5459 Other low back pain: Secondary | ICD-10-CM | POA: Insufficient documentation

## 2023-11-09 DIAGNOSIS — R2689 Other abnormalities of gait and mobility: Secondary | ICD-10-CM | POA: Diagnosis present
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
## 2023-11-11 ENCOUNTER — Ambulatory Visit: Payer: Medicare HMO | Admitting: Physical Therapy

## 2023-11-11 ENCOUNTER — Encounter: Payer: Self-pay | Admitting: Physical Therapy

## 2023-11-11 DIAGNOSIS — M5459 Other low back pain: Secondary | ICD-10-CM

## 2023-11-11 DIAGNOSIS — M6281 Muscle weakness (generalized): Secondary | ICD-10-CM | POA: Diagnosis not present

## 2023-11-11 DIAGNOSIS — M62830 Muscle spasm of back: Secondary | ICD-10-CM | POA: Diagnosis not present

## 2023-11-11 DIAGNOSIS — R252 Cramp and spasm: Secondary | ICD-10-CM | POA: Diagnosis not present

## 2023-11-11 DIAGNOSIS — R2689 Other abnormalities of gait and mobility: Secondary | ICD-10-CM | POA: Diagnosis not present

## 2023-11-11 DIAGNOSIS — M542 Cervicalgia: Secondary | ICD-10-CM | POA: Diagnosis not present

## 2023-11-11 DIAGNOSIS — M5416 Radiculopathy, lumbar region: Secondary | ICD-10-CM | POA: Diagnosis not present
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
## 2023-11-12 ENCOUNTER — Telehealth: Payer: Self-pay

## 2023-11-12 NOTE — Telephone Encounter (Signed)
 Medical records request  Copied from CRM [id number]. Topic: Medical Record Request - Records Request >> Nov 12, 2023 12:50 PM Ivette P wrote: Reason for CRM: Joan - [employer]   Joan has been trying to get access to medical records since 08/27/2023.States papers have been faxed over.    Requesting a callback at [phone]

## 2023-11-16 ENCOUNTER — Other Ambulatory Visit: Payer: Self-pay | Admitting: Family Medicine

## 2023-11-17 ENCOUNTER — Ambulatory Visit: Payer: Medicare HMO | Admitting: Physical Therapy

## 2023-11-17 ENCOUNTER — Encounter: Payer: Self-pay | Admitting: Physical Therapy

## 2023-11-17 DIAGNOSIS — M5459 Other low back pain: Secondary | ICD-10-CM

## 2023-11-17 DIAGNOSIS — M6281 Muscle weakness (generalized): Secondary | ICD-10-CM | POA: Diagnosis not present

## 2023-11-17 DIAGNOSIS — R2689 Other abnormalities of gait and mobility: Secondary | ICD-10-CM | POA: Diagnosis not present

## 2023-11-17 DIAGNOSIS — M62830 Muscle spasm of back: Secondary | ICD-10-CM | POA: Diagnosis not present

## 2023-11-17 DIAGNOSIS — R252 Cramp and spasm: Secondary | ICD-10-CM | POA: Diagnosis not present

## 2023-11-17 DIAGNOSIS — M542 Cervicalgia: Secondary | ICD-10-CM | POA: Diagnosis not present

## 2023-11-17 DIAGNOSIS — M5416 Radiculopathy, lumbar region: Secondary | ICD-10-CM | POA: Diagnosis not present
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
## 2023-11-18 ENCOUNTER — Encounter (INDEPENDENT_AMBULATORY_CARE_PROVIDER_SITE_OTHER): Payer: Medicare HMO | Admitting: Sports Medicine

## 2023-11-18 DIAGNOSIS — R972 Elevated prostate specific antigen [PSA]: Secondary | ICD-10-CM | POA: Diagnosis not present
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
## 2023-11-19 ENCOUNTER — Encounter: Payer: Self-pay | Admitting: Physical Therapy

## 2023-11-19 ENCOUNTER — Ambulatory Visit: Payer: Medicare HMO | Admitting: Physical Therapy

## 2023-11-19 DIAGNOSIS — R252 Cramp and spasm: Secondary | ICD-10-CM | POA: Diagnosis not present

## 2023-11-19 DIAGNOSIS — M62830 Muscle spasm of back: Secondary | ICD-10-CM | POA: Diagnosis not present

## 2023-11-19 DIAGNOSIS — M5459 Other low back pain: Secondary | ICD-10-CM | POA: Diagnosis not present

## 2023-11-19 DIAGNOSIS — R2689 Other abnormalities of gait and mobility: Secondary | ICD-10-CM | POA: Diagnosis not present

## 2023-11-19 DIAGNOSIS — M542 Cervicalgia: Secondary | ICD-10-CM | POA: Diagnosis not present

## 2023-11-19 DIAGNOSIS — M5416 Radiculopathy, lumbar region: Secondary | ICD-10-CM | POA: Diagnosis not present

## 2023-11-19 DIAGNOSIS — M6281 Muscle weakness (generalized): Secondary | ICD-10-CM | POA: Diagnosis not present

## 2023-11-19 NOTE — Telephone Encounter (Signed)

## 2023-11-20 DIAGNOSIS — R972 Elevated prostate specific antigen [PSA]: Secondary | ICD-10-CM | POA: Diagnosis not present

## 2023-11-23 ENCOUNTER — Ambulatory Visit: Payer: Medicare HMO | Admitting: Physical Therapy

## 2023-11-23 ENCOUNTER — Encounter: Payer: Self-pay | Admitting: Physical Therapy

## 2023-11-23 DIAGNOSIS — M542 Cervicalgia: Secondary | ICD-10-CM | POA: Diagnosis not present

## 2023-11-23 DIAGNOSIS — M5416 Radiculopathy, lumbar region: Secondary | ICD-10-CM | POA: Diagnosis not present

## 2023-11-23 DIAGNOSIS — M6281 Muscle weakness (generalized): Secondary | ICD-10-CM | POA: Diagnosis not present

## 2023-11-23 DIAGNOSIS — M62830 Muscle spasm of back: Secondary | ICD-10-CM | POA: Diagnosis not present

## 2023-11-23 DIAGNOSIS — M5459 Other low back pain: Secondary | ICD-10-CM

## 2023-11-23 DIAGNOSIS — R2689 Other abnormalities of gait and mobility: Secondary | ICD-10-CM | POA: Diagnosis not present

## 2023-11-23 DIAGNOSIS — R252 Cramp and spasm: Secondary | ICD-10-CM | POA: Diagnosis not present
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
## 2023-11-26 ENCOUNTER — Ambulatory Visit: Payer: Medicare HMO | Admitting: Physical Therapy

## 2023-11-30 ENCOUNTER — Ambulatory Visit: Payer: Medicare HMO | Admitting: Neurology

## 2023-11-30 ENCOUNTER — Telehealth: Payer: Self-pay

## 2023-11-30 DIAGNOSIS — M5417 Radiculopathy, lumbosacral region: Secondary | ICD-10-CM

## 2023-11-30 DIAGNOSIS — R202 Paresthesia of skin: Secondary | ICD-10-CM

## 2023-11-30 NOTE — Telephone Encounter (Signed)
 Copied from CRM [id number]. Topic: Medical Record Request - Other >> Nov 30, 2023  2:25 PM Geroge Baseman wrote: Reason for CRM: [employer] calling in regard to medical records request. They still have not received a call back. They have had issues sending request to the office and documents not making it to the copy service the office uses. They need to speak with the office directly in regard to the issue. [facility] was notified as this is also about a patient of Dr. Karie Schwalbe. They refused the call. I was put on hold for 15 mins and then got dropped, and then no one answered when I attempted to call back. Caller is needing assistance as no one has attempted to reach them back about this issue he states they called on 2/6. Gave him medical records but did not know if they could help. [phone] callback number.

## 2023-11-30 NOTE — Telephone Encounter (Signed)
 I spoke with the [employer] agent directly regarding this request. The legal office is requesting medical records from the following DOS: 09/03/22, 09/10/22, 09/16/22, 10/13/22, 10/20/22, 10/25/22, 11/11/22, and 03/31/23, including all bills showing payments and adjustments. As well as physical therapy bills from 10/21/22 - 11/13/22. The agent was informed that the request is handled by an external medical records department. The agent was provided with the medical records # [phone] for further assistance.

## 2023-11-30 NOTE — Procedures (Signed)
 [facility]  [address] [place], [address]  [place], [place] [address] Tel: [phone] Fax: [phone] Test Date:  11/30/2023  Patient: Joel Hopkins DOB: 12/03/1945 Physician: Jacquelyne Balint, MD  Sex: Male Height: 6\' 1"  Ref Phys: Rodney Langton, MD  ID#: [id number]   Technician:    History: This is a 78 year old male with pain and weakness in his legs and arms.  NCV & EMG Findings: Extensive electrodiagnostic evaluation of bilateral lower limbs shows: Bilateral sural and superficial peroneal/fibular sensory responses are within normal limits. Bilateral peroneal/fibular (EDB) and tibial (AH) motor responses are within normal limits. Bilateral H reflexes are absent. Chronic motor axon loss changes without accompanying active denervation changes are seen in left tibialis anterior, left gluteus medius, left rectus femoris, left adductor longus, left iliacus, right medial head of gastrocnemius, right short head of biceps femoris, and bilateral lumbosacral paraspinal (L5 level) muscles.  Impression: This is an abnormal study. The findings are most consistent with the following: The residuals of old intraspinal canal lesion(s) (ie: motor radiculopathy) at the left L3-L5 roots or segments, mild in degree electrically. The residuals on an old intraspinal canal lesion (ie: motor radiculopathy) at the right S1 root or segment, mild in degree electrically. No electrodiagnostic evidence of a large fiber sensorimotor neuropathy. Bilateral upper limb studies are scheduled for 12/07/23.    ___________________________ Jacquelyne Balint, MD    Nerve Conduction Studies Motor Nerve Results    Latency Amplitude F-Lat Segment Distance CV Comment  Site (ms) Norm (mV) Norm (ms)  (cm) (m/s) Norm   Left Fibular (EDB) Motor  Ankle 4.4  < 6.0 3.9  > 2.5        Bel fib head 11.0 - 3.3 -  Bel fib head-Ankle 31 47  > 40   Pop fossa 13.2 - 3.3 -  Pop fossa-Bel fib head 10 45 -   Right Fibular  (EDB) Motor  Ankle 3.9  < 6.0 3.8  > 2.5        Bel fib head 11.8 - 2.6 -  Bel fib head-Ankle 33 42  > 40   Pop fossa 14.0 - 2.4 -  Pop fossa-Bel fib head 10 45 -   Left Tibial (AH) Motor  Ankle 4.4  < 6.0 7.0  > 4.0        Knee 14.3 - 5.3 -  Knee-Ankle 43 43  > 40   Right Tibial (AH) Motor  Ankle 4.0  < 6.0 6.8  > 4.0        Knee 13.9 - 6.1 -  Knee-Ankle 45 45  > 40    Sensory Sites    Neg Peak Lat Amplitude (O-P) Segment Distance Velocity Comment  Site (ms) Norm (V) Norm  (cm) (ms)   Left Superficial Fibular Sensory  14 cm-Ankle 3.0  < 4.6 6  > 3 14 cm-Ankle 14    Right Superficial Fibular Sensory  14 cm-Ankle 2.2  < 4.6 6  > 3 14 cm-Ankle 14    Left Sural Sensory  Calf-Lat mall 3.4  < 4.6 4  > 3 Calf-Lat mall 12    Right Sural Sensory  Calf-Lat mall 3.1  < 4.6 3  > 3 Calf-Lat mall 12     H-Reflex Results    M-Lat H Lat H Neg Amp H-M Lat  Site (ms) (ms) Norm (mV) (ms)  Left Tibial H-Reflex  Pop fossa 7.3 -  < 35.0         -        -  Right Tibial H-Reflex  Pop fossa 7.8 -  < 35.0 - -   Electromyography   Side Muscle Ins.Act Fibs Fasc Recrt Amp Dur Poly Activation Comment  Left Tib ant Nml Nml Nml *1- *1+ *1+ *1+ Nml N/A  Left Gastroc MH Nml Nml Nml Nml Nml Nml Nml Nml N/A  Left Rectus fem Nml Nml Nml *1- *1+ *1+ *1+ Nml N/A  Left Add longus Nml Nml Nml *1- *1+ *1+ *1+ Nml N/A  Left Iliacus Nml Nml Nml *2- *1+ *1+ *1+ Nml N/A  Left Biceps fem SH Nml Nml Nml Nml Nml Nml Nml Nml N/A  Left Gluteus med Nml Nml Nml *1- *1+ *1+ Nml Nml N/A  Left Lumbar PSP lower Nml Nml Nml *1- *1+ *1+ *1+ Nml N/A  Right Tib ant Nml Nml Nml Nml Nml Nml Nml Nml N/A  Right Gastroc MH Nml Nml Nml *1- *1+ *1+ *1+ Nml N/A  Right Rectus fem Nml Nml Nml Nml Nml Nml Nml Nml N/A  Right Add longus Nml Nml Nml Nml Nml Nml Nml Nml N/A  Right Biceps fem SH Nml Nml Nml *1- *1+ *1+ *1+ Nml N/A  Right Gluteus med Nml Nml Nml Nml Nml Nml Nml Nml N/A  Right Lumbar PSP lower Nml Nml Nml *1- *1+ *1+ *1+ Nml  N/A      Waveforms:  Motor           Sensory           H-Reflex

## 2023-12-01 ENCOUNTER — Encounter: Payer: Self-pay | Admitting: Physical Therapy

## 2023-12-01 ENCOUNTER — Ambulatory Visit: Payer: Medicare HMO | Admitting: Physical Therapy

## 2023-12-01 DIAGNOSIS — M5459 Other low back pain: Secondary | ICD-10-CM | POA: Diagnosis not present

## 2023-12-01 DIAGNOSIS — M6281 Muscle weakness (generalized): Secondary | ICD-10-CM | POA: Diagnosis not present

## 2023-12-01 DIAGNOSIS — M5416 Radiculopathy, lumbar region: Secondary | ICD-10-CM | POA: Diagnosis not present

## 2023-12-01 DIAGNOSIS — R2689 Other abnormalities of gait and mobility: Secondary | ICD-10-CM | POA: Diagnosis not present

## 2023-12-01 DIAGNOSIS — M62830 Muscle spasm of back: Secondary | ICD-10-CM | POA: Diagnosis not present

## 2023-12-01 DIAGNOSIS — R252 Cramp and spasm: Secondary | ICD-10-CM | POA: Diagnosis not present

## 2023-12-01 DIAGNOSIS — M542 Cervicalgia: Secondary | ICD-10-CM | POA: Diagnosis not present
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
## 2023-12-03 ENCOUNTER — Ambulatory Visit: Payer: Medicare HMO | Admitting: Rehabilitative and Restorative Service Providers"

## 2023-12-03 ENCOUNTER — Encounter: Payer: Self-pay | Admitting: Rehabilitative and Restorative Service Providers"

## 2023-12-03 DIAGNOSIS — M5416 Radiculopathy, lumbar region: Secondary | ICD-10-CM | POA: Diagnosis not present

## 2023-12-03 DIAGNOSIS — R2689 Other abnormalities of gait and mobility: Secondary | ICD-10-CM | POA: Diagnosis not present

## 2023-12-03 DIAGNOSIS — M5459 Other low back pain: Secondary | ICD-10-CM

## 2023-12-03 DIAGNOSIS — R252 Cramp and spasm: Secondary | ICD-10-CM | POA: Diagnosis not present

## 2023-12-03 DIAGNOSIS — M542 Cervicalgia: Secondary | ICD-10-CM | POA: Diagnosis not present

## 2023-12-03 DIAGNOSIS — M6281 Muscle weakness (generalized): Secondary | ICD-10-CM | POA: Diagnosis not present

## 2023-12-03 DIAGNOSIS — M62830 Muscle spasm of back: Secondary | ICD-10-CM
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
## 2023-12-04 DIAGNOSIS — R972 Elevated prostate specific antigen [PSA]: Secondary | ICD-10-CM | POA: Diagnosis not present

## 2023-12-04 DIAGNOSIS — C61 Malignant neoplasm of prostate: Secondary | ICD-10-CM | POA: Diagnosis not present

## 2023-12-04 DIAGNOSIS — N4232 Atypical small acinar proliferation of prostate: Secondary | ICD-10-CM | POA: Diagnosis not present

## 2023-12-04 DIAGNOSIS — N4231 Prostatic intraepithelial neoplasia: Secondary | ICD-10-CM | POA: Diagnosis not present

## 2023-12-07 ENCOUNTER — Ambulatory Visit: Payer: Medicare HMO | Admitting: Neurology

## 2023-12-07 DIAGNOSIS — G5603 Carpal tunnel syndrome, bilateral upper limbs: Secondary | ICD-10-CM

## 2023-12-07 DIAGNOSIS — M5412 Radiculopathy, cervical region: Secondary | ICD-10-CM

## 2023-12-07 DIAGNOSIS — R202 Paresthesia of skin: Secondary | ICD-10-CM | POA: Diagnosis not present

## 2023-12-07 NOTE — Procedures (Signed)
 [facility]  [address] [place], [address]  [place], [place] [address] Tel: [phone] Fax: [phone] Test Date:  12/07/2023  Patient: Joel Hopkins DOB: [birth year] Physician: Jacquelyne Balint, MD  Sex: Male Height: 6\' 1"  Ref Phys: Rodney Langton, MD  ID#: [id number]   Technician:    History: This is a 78 year old male with pain and weakness in his arms and legs.  NCV & EMG Findings: Extensive electrodiagnostic evaluation of bilateral upper limbs shows: Bilateral median sensory responses show prolonged distal peak latency (L4.2, R4.1 ms). Bilateral ulnar and radial sensory responses are within normal limits. Bilateral median (APB) and ulnar (ADM) motor responses are within normal limits. Chronic motor axon loss changes without accompanying active denervation changes are seen in bilateral first dorsal interosseous and bilateral extensor indicis proprius muscles. Cervical paraspinal muscles were deferred due to prior surgery.  Impression: This is an abnormal study. 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 The findings are moderate in degree electrically on the left and mild in degree electrically on the right. Evidence of bilateral median mononeuropathy at or distal to the wrist, consistent with carpal tunnel syndrome, mild in degree electrically bilaterally. Screening studies for right or left ulnar or radial mononeuropathies are normal.    ___________________________ Jacquelyne Balint, MD    Nerve Conduction Studies Motor Nerve Results    Latency Amplitude F-Lat Segment Distance CV Comment  Site (ms) Norm (mV) Norm (ms)  (cm) (m/s) Norm   Left Median (APB) Motor  Wrist 3.2  < 4.0 5.8  > 5.0        Elbow 9.3 - 5.5 -  Elbow-Wrist 30.5 50  > 50   Right Median (APB) Motor  Wrist 3.3  < 4.0 6.1  > 5.0        Elbow 9.3 - 5.4 -  Elbow-Wrist 30.5 51  >  50   Left Ulnar (ADM) Motor  Wrist 2.6  < 3.1 8.5  > 7.0        Bel elbow 7.1 - 7.2 -  Bel elbow-Wrist 24 53  > 50   Ab elbow 9.1 - 6.9 -  Ab elbow-Bel elbow 10 50 -   Right Ulnar (ADM) Motor  Wrist 2.5  < 3.1 9.4  > 7.0        Bel elbow 6.8 - 8.2 -  Bel elbow-Wrist 23 53  > 50   Ab elbow 8.8 - 7.9 -  Ab elbow-Bel elbow 10 50 -    Sensory Sites    Neg Peak Lat Amplitude (O-P) Segment Distance Velocity Comment  Site (ms) Norm (V) Norm  (cm) (ms)   Left Median Sensory  Wrist-Dig II *4.2  < 3.8 12  > 10 Wrist-Dig II 13    Right Median Sensory  Wrist-Dig II *4.1  < 3.8 10  > 10 Wrist-Dig II 13    Left Radial Sensory  Forearm-Wrist 2.1  < 2.8 13  > 10 Forearm-Wrist 10    Right Radial Sensory  Forearm-Wrist 2.8  < 2.8 12  > 10 Forearm-Wrist 10    Left Ulnar Sensory  Wrist-Dig V 3.2  < 3.2 10  > 5 Wrist-Dig V 11    Right Ulnar Sensory  Wrist-Dig V 3.2  < 3.2 13  > 5 Wrist-Dig V 11     Electromyography   Side Muscle Ins.Act Fibs Fasc Recrt Amp Dur Poly  Activation Comment  Right FDI Nml Nml Nml *1- *1+ *1+ Nml Nml N/A  Right EIP Nml Nml Nml *1- *1+ *1+ Nml Nml N/A  Right Pronator teres Nml Nml Nml Nml Nml Nml Nml Nml N/A  Right Biceps Nml Nml Nml Nml Nml Nml Nml Nml N/A  Right Triceps Nml Nml Nml Nml Nml Nml Nml Nml N/A  Right Deltoid Nml Nml Nml Nml Nml Nml Nml Nml N/A  Left FDI Nml Nml Nml *2- *1+ *1+ Nml Nml N/A  Left EIP Nml Nml Nml *2- *1+ *1+ Nml Nml N/A  Left Pronator teres Nml Nml Nml Nml Nml Nml Nml Nml N/A  Left Biceps Nml Nml Nml Nml Nml Nml Nml Nml N/A  Left Triceps Nml Nml Nml Nml Nml Nml Nml Nml N/A  Left Deltoid Nml Nml Nml Nml Nml Nml Nml Nml N/A      Waveforms:  Motor           Sensory

## 2023-12-08 ENCOUNTER — Ambulatory Visit: Payer: Medicare HMO | Attending: Sports Medicine | Admitting: Physical Therapy

## 2023-12-08 ENCOUNTER — Encounter: Payer: Self-pay | Admitting: Physical Therapy

## 2023-12-08 DIAGNOSIS — M6281 Muscle weakness (generalized): Secondary | ICD-10-CM | POA: Diagnosis present

## 2023-12-08 DIAGNOSIS — M542 Cervicalgia: Secondary | ICD-10-CM | POA: Insufficient documentation

## 2023-12-08 DIAGNOSIS — M5459 Other low back pain: Secondary | ICD-10-CM | POA: Diagnosis present

## 2023-12-08 DIAGNOSIS — M62830 Muscle spasm of back: Secondary | ICD-10-CM | POA: Insufficient documentation

## 2023-12-08 DIAGNOSIS — M5416 Radiculopathy, lumbar region: Secondary | ICD-10-CM | POA: Insufficient documentation

## 2023-12-08 DIAGNOSIS — R252 Cramp and spasm: Secondary | ICD-10-CM | POA: Insufficient documentation

## 2023-12-08 DIAGNOSIS — R2689 Other abnormalities of gait and mobility: Secondary | ICD-10-CM | POA: Diagnosis present
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
## 2023-12-09 DIAGNOSIS — C61 Malignant neoplasm of prostate: Secondary | ICD-10-CM | POA: Diagnosis not present
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
## 2023-12-10 ENCOUNTER — Encounter: Payer: Self-pay | Admitting: Sports Medicine

## 2023-12-10 ENCOUNTER — Ambulatory Visit (INDEPENDENT_AMBULATORY_CARE_PROVIDER_SITE_OTHER): Payer: Medicare HMO | Admitting: Sports Medicine

## 2023-12-10 ENCOUNTER — Ambulatory Visit: Payer: Medicare HMO | Admitting: Physical Therapy

## 2023-12-10 ENCOUNTER — Encounter: Payer: Self-pay | Admitting: Physical Therapy

## 2023-12-10 DIAGNOSIS — M5416 Radiculopathy, lumbar region: Secondary | ICD-10-CM | POA: Diagnosis not present

## 2023-12-10 DIAGNOSIS — M48061 Spinal stenosis, lumbar region without neurogenic claudication: Secondary | ICD-10-CM | POA: Diagnosis not present

## 2023-12-10 DIAGNOSIS — M62830 Muscle spasm of back: Secondary | ICD-10-CM | POA: Diagnosis not present

## 2023-12-10 DIAGNOSIS — M542 Cervicalgia: Secondary | ICD-10-CM | POA: Diagnosis not present

## 2023-12-10 DIAGNOSIS — R2689 Other abnormalities of gait and mobility: Secondary | ICD-10-CM | POA: Diagnosis not present

## 2023-12-10 DIAGNOSIS — M5030 Other cervical disc degeneration, unspecified cervical region: Secondary | ICD-10-CM | POA: Diagnosis not present

## 2023-12-10 DIAGNOSIS — C61 Malignant neoplasm of prostate: Secondary | ICD-10-CM | POA: Diagnosis not present

## 2023-12-10 DIAGNOSIS — R972 Elevated prostate specific antigen [PSA]: Secondary | ICD-10-CM | POA: Diagnosis not present

## 2023-12-10 DIAGNOSIS — M5412 Radiculopathy, cervical region: Secondary | ICD-10-CM | POA: Diagnosis not present

## 2023-12-10 DIAGNOSIS — M4802 Spinal stenosis, cervical region: Secondary | ICD-10-CM | POA: Diagnosis not present

## 2023-12-10 DIAGNOSIS — M6281 Muscle weakness (generalized): Secondary | ICD-10-CM | POA: Diagnosis not present

## 2023-12-10 DIAGNOSIS — R1013 Epigastric pain: Secondary | ICD-10-CM | POA: Diagnosis not present

## 2023-12-10 DIAGNOSIS — M5459 Other low back pain: Secondary | ICD-10-CM

## 2023-12-10 DIAGNOSIS — R252 Cramp and spasm: Secondary | ICD-10-CM

## 2023-12-10 DIAGNOSIS — G894 Chronic pain syndrome: Secondary | ICD-10-CM | POA: Diagnosis not present

## 2023-12-10 DIAGNOSIS — M47816 Spondylosis without myelopathy or radiculopathy, lumbar region: Secondary | ICD-10-CM | POA: Diagnosis not present

## 2023-12-10 MED ORDER — PREGABALIN 50 MG PO CAPS: ORAL_CAPSULE | ORAL | 3 refills | Status: DC

## 2023-12-10 NOTE — Assessment & Plan Note (Signed)
 Chronic axial neck pain, multiple injections in the form of epidurals, facet RFA's, multiple medications all not effective, he has been working with Dr. Laurian Brim, Dr. Petra Kuba and the team. They were requesting nerve conduction and EMGs, we did set this up for the upper and lower extremities. He does have an appointment coming up with neurosurgery. He knows that I do not have much else to offer as a sports medicine doctor, I will go ahead and start Lyrica and an up titration, and he can follow this up with his neurosurgeon.

## 2023-12-10 NOTE — Assessment & Plan Note (Signed)
 As above, severe lumbar spinal stenosis, RFA's, epidurals, medication such as Cymbalta, gabapentin, NSAIDs, all ineffective. We will start Lyrica low-dose and he can follow this up with his neurosurgery team. Of note we did get upper and lower extremity nerve conduction and EMGs for his surgical team.

## 2023-12-10 NOTE — Progress Notes (Signed)
    Procedures performed today:    None.  Independent interpretation of notes and tests performed by another provider:   None.  Brief History, Exam, Impression, and Recommendations:    Dyspepsia Resolved with dietary changes and pantoprazole. He is off of pantoprazole now but can restart if symptoms recur.  Cervical radiculopathy Chronic axial neck pain, multiple injections in the form of epidurals, facet RFA's, multiple medications all not effective, he has been working with Dr. Laurian Brim, Dr. Petra Kuba and the team. They were requesting nerve conduction and EMGs, we did set this up for the upper and lower extremities. He does have an appointment coming up with neurosurgery. He knows that I do not have much else to offer as a sports medicine doctor, I will go ahead and start Lyrica and an up titration, and he can follow this up with his neurosurgeon.  Lumbar spondylosis As above, severe lumbar spinal stenosis, RFA's, epidurals, medication such as Cymbalta, gabapentin, NSAIDs, all ineffective. We will start Lyrica low-dose and he can follow this up with his neurosurgery team. Of note we did get upper and lower extremity nerve conduction and EMGs for his surgical team.    ____________________________________________ Ihor Austin. Benjamin Stain, M.D., ABFM., CAQSM., AME. Primary Care and Sports Medicine [facility]  Adjunct Professor of Family Medicine  [employer]  Restaurant manager, fast food

## 2023-12-10 NOTE — Assessment & Plan Note (Signed)
 Resolved with dietary changes and pantoprazole. He is off of pantoprazole now but can restart if symptoms recur.

## 2023-12-11 LAB — PSA, TOTAL AND FREE
PSA, Free Pct: 10.2 %
PSA, Free: 1.89 ng/mL
Prostate Specific Ag, Serum: 18.5 ng/mL — ABNORMAL HIGH (ref 0.0–4.0)
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
## 2023-12-15 ENCOUNTER — Ambulatory Visit: Payer: Medicare HMO | Admitting: Physical Therapy

## 2023-12-15 ENCOUNTER — Encounter: Payer: Self-pay | Admitting: Physical Therapy

## 2023-12-15 DIAGNOSIS — R590 Localized enlarged lymph nodes: Secondary | ICD-10-CM | POA: Diagnosis not present

## 2023-12-15 DIAGNOSIS — M5416 Radiculopathy, lumbar region: Secondary | ICD-10-CM

## 2023-12-15 DIAGNOSIS — R918 Other nonspecific abnormal finding of lung field: Secondary | ICD-10-CM | POA: Diagnosis not present

## 2023-12-15 DIAGNOSIS — M5459 Other low back pain: Secondary | ICD-10-CM

## 2023-12-15 DIAGNOSIS — M6281 Muscle weakness (generalized): Secondary | ICD-10-CM | POA: Diagnosis not present

## 2023-12-15 DIAGNOSIS — M62830 Muscle spasm of back: Secondary | ICD-10-CM | POA: Diagnosis not present

## 2023-12-15 DIAGNOSIS — M542 Cervicalgia: Secondary | ICD-10-CM | POA: Diagnosis not present

## 2023-12-15 DIAGNOSIS — R2689 Other abnormalities of gait and mobility: Secondary | ICD-10-CM | POA: Diagnosis not present

## 2023-12-15 DIAGNOSIS — C61 Malignant neoplasm of prostate: Secondary | ICD-10-CM | POA: Diagnosis not present

## 2023-12-15 DIAGNOSIS — R252 Cramp and spasm: Secondary | ICD-10-CM | POA: Diagnosis not present

## 2023-12-17 ENCOUNTER — Encounter: Payer: Self-pay | Admitting: Physical Therapy

## 2023-12-17 ENCOUNTER — Ambulatory Visit: Payer: Medicare HMO | Admitting: Physical Therapy

## 2023-12-17 DIAGNOSIS — M6281 Muscle weakness (generalized): Secondary | ICD-10-CM | POA: Diagnosis not present

## 2023-12-17 DIAGNOSIS — M5459 Other low back pain: Secondary | ICD-10-CM

## 2023-12-17 DIAGNOSIS — M62830 Muscle spasm of back: Secondary | ICD-10-CM | POA: Diagnosis not present

## 2023-12-17 DIAGNOSIS — R252 Cramp and spasm: Secondary | ICD-10-CM | POA: Diagnosis not present

## 2023-12-17 DIAGNOSIS — R2689 Other abnormalities of gait and mobility: Secondary | ICD-10-CM | POA: Diagnosis not present

## 2023-12-17 DIAGNOSIS — M5416 Radiculopathy, lumbar region: Secondary | ICD-10-CM

## 2023-12-17 DIAGNOSIS — M542 Cervicalgia: Secondary | ICD-10-CM | POA: Diagnosis not present
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
## 2023-12-18 DIAGNOSIS — Z191 Hormone sensitive malignancy status: Secondary | ICD-10-CM | POA: Diagnosis not present

## 2023-12-18 DIAGNOSIS — C61 Malignant neoplasm of prostate: Secondary | ICD-10-CM | POA: Diagnosis not present

## 2023-12-25 DIAGNOSIS — M47812 Spondylosis without myelopathy or radiculopathy, cervical region: Secondary | ICD-10-CM | POA: Diagnosis not present

## 2023-12-25 DIAGNOSIS — M4802 Spinal stenosis, cervical region: Secondary | ICD-10-CM | POA: Diagnosis not present

## 2023-12-25 DIAGNOSIS — M5030 Other cervical disc degeneration, unspecified cervical region: Secondary | ICD-10-CM | POA: Diagnosis not present

## 2023-12-25 DIAGNOSIS — M5412 Radiculopathy, cervical region: Secondary | ICD-10-CM | POA: Diagnosis not present

## 2023-12-28 DIAGNOSIS — K5730 Diverticulosis of large intestine without perforation or abscess without bleeding: Secondary | ICD-10-CM | POA: Diagnosis not present

## 2023-12-28 DIAGNOSIS — Z1211 Encounter for screening for malignant neoplasm of colon: Secondary | ICD-10-CM | POA: Diagnosis not present

## 2023-12-28 DIAGNOSIS — Z800 Family history of malignant neoplasm of digestive organs: Secondary | ICD-10-CM | POA: Diagnosis not present

## 2023-12-28 DIAGNOSIS — K648 Other hemorrhoids: Secondary | ICD-10-CM | POA: Diagnosis not present

## 2024-01-05 DIAGNOSIS — C61 Malignant neoplasm of prostate: Secondary | ICD-10-CM | POA: Diagnosis not present

## 2024-01-14 DIAGNOSIS — C61 Malignant neoplasm of prostate: Secondary | ICD-10-CM | POA: Diagnosis not present

## 2024-01-21 ENCOUNTER — Ambulatory Visit (INDEPENDENT_AMBULATORY_CARE_PROVIDER_SITE_OTHER): Admitting: Sports Medicine

## 2024-01-21 ENCOUNTER — Encounter: Payer: Self-pay | Admitting: Sports Medicine

## 2024-01-21 DIAGNOSIS — M5412 Radiculopathy, cervical region: Secondary | ICD-10-CM

## 2024-01-21 DIAGNOSIS — M47816 Spondylosis without myelopathy or radiculopathy, lumbar region: Secondary | ICD-10-CM

## 2024-01-21 MED ORDER — PREGABALIN 75 MG PO CAPS: ORAL_CAPSULE | ORAL | 3 refills | Status: DC

## 2024-01-21 NOTE — Progress Notes (Signed)
    Procedures performed today:    None.  Independent interpretation of notes and tests performed by another provider:   None.  Brief History, Exam, Impression, and Recommendations:    Lumbar spondylosis As above, severe lumbar spinal stenosis, RFA's, epidurals, medication such as Cymbalta, gabapentin, NSAIDs, all ineffective. We will start Lyrica low-dose and he can follow this up with his neurosurgery team. Of note we did get upper and lower extremity nerve conduction and EMGs for his surgical team.  Updated history Joel Hopkins did get some relief from Lyrica 50, increasing to 75 with a titration up to 3 times daily. He can return to see me in 4 to 6 weeks to reevaluate.  Cervical radiculopathy Chronic axial neck pain, multiple injections in the form of epidurals, facet RFA's, multiple medications all not effective, he has been working with Dr. Vonn Guard, Dr. Eladio Graven and the team. They were requesting nerve conduction and EMGs, we did set this up for the upper and lower extremities. He does have an appointment coming up with neurosurgery. He knows that I do not have much else to offer as a sports medicine doctor, I will go ahead and start Lyrica and an up titration, and he can follow this up with his neurosurgeon.  Update: He did get some relief from Lyrica 50, increasing to 75 with a titration up to 3 times daily.    ____________________________________________ Joselyn Nicely. Sandy Crumb, M.D., ABFM., CAQSM., AME. Primary Care and Sports Medicine [facility]  Adjunct Professor of [facility] Medicine  [employer]  Restaurant manager, fast food

## 2024-01-21 NOTE — Assessment & Plan Note (Signed)
 Chronic axial neck pain, multiple injections in the form of epidurals, facet RFA's, multiple medications all not effective, he has been working with Dr. Vonn Guard, Dr. Eladio Graven and the team. They were requesting nerve conduction and EMGs, we did set this up for the upper and lower extremities. He does have an appointment coming up with neurosurgery. He knows that I do not have much else to offer as a sports medicine doctor, I will go ahead and start Lyrica and an up titration, and he can follow this up with his neurosurgeon.  Update: He did get some relief from Lyrica 50, increasing to 75 with a titration up to 3 times daily.

## 2024-01-21 NOTE — Assessment & Plan Note (Signed)
 As above, severe lumbar spinal stenosis, RFA's, epidurals, medication such as Cymbalta, gabapentin, NSAIDs, all ineffective. We will start Lyrica low-dose and he can follow this up with his neurosurgery team. Of note we did get upper and lower extremity nerve conduction and EMGs for his surgical team.  Updated history Joel Hopkins did get some relief from Lyrica 50, increasing to 75 with a titration up to 3 times daily. He can return to see me in 4 to 6 weeks to reevaluate.

## 2024-01-25 DIAGNOSIS — C61 Malignant neoplasm of prostate: Secondary | ICD-10-CM | POA: Diagnosis not present

## 2024-01-25 DIAGNOSIS — Z191 Hormone sensitive malignancy status: Secondary | ICD-10-CM | POA: Diagnosis not present

## 2024-01-29 DIAGNOSIS — Z191 Hormone sensitive malignancy status: Secondary | ICD-10-CM | POA: Diagnosis not present

## 2024-01-29 DIAGNOSIS — C61 Malignant neoplasm of prostate: Secondary | ICD-10-CM | POA: Diagnosis not present

## 2024-02-08 DIAGNOSIS — Z510 Encounter for antineoplastic radiation therapy: Secondary | ICD-10-CM | POA: Diagnosis not present

## 2024-02-08 DIAGNOSIS — Z191 Hormone sensitive malignancy status: Secondary | ICD-10-CM | POA: Diagnosis not present

## 2024-02-08 DIAGNOSIS — C61 Malignant neoplasm of prostate: Secondary | ICD-10-CM | POA: Diagnosis not present

## 2024-02-15 DIAGNOSIS — C61 Malignant neoplasm of prostate: Secondary | ICD-10-CM | POA: Diagnosis not present

## 2024-02-15 DIAGNOSIS — Z510 Encounter for antineoplastic radiation therapy: Secondary | ICD-10-CM | POA: Diagnosis not present

## 2024-02-15 DIAGNOSIS — Z191 Hormone sensitive malignancy status: Secondary | ICD-10-CM | POA: Diagnosis not present

## 2024-02-16 DIAGNOSIS — Z191 Hormone sensitive malignancy status: Secondary | ICD-10-CM | POA: Diagnosis not present

## 2024-02-16 DIAGNOSIS — Z510 Encounter for antineoplastic radiation therapy: Secondary | ICD-10-CM | POA: Diagnosis not present

## 2024-02-16 DIAGNOSIS — C61 Malignant neoplasm of prostate: Secondary | ICD-10-CM | POA: Diagnosis not present

## 2024-02-17 DIAGNOSIS — Z510 Encounter for antineoplastic radiation therapy: Secondary | ICD-10-CM | POA: Diagnosis not present

## 2024-02-17 DIAGNOSIS — Z191 Hormone sensitive malignancy status: Secondary | ICD-10-CM | POA: Diagnosis not present

## 2024-02-17 DIAGNOSIS — C61 Malignant neoplasm of prostate: Secondary | ICD-10-CM | POA: Diagnosis not present

## 2024-02-18 DIAGNOSIS — Z510 Encounter for antineoplastic radiation therapy: Secondary | ICD-10-CM | POA: Diagnosis not present

## 2024-02-18 DIAGNOSIS — C61 Malignant neoplasm of prostate: Secondary | ICD-10-CM | POA: Diagnosis not present

## 2024-02-18 DIAGNOSIS — Z191 Hormone sensitive malignancy status: Secondary | ICD-10-CM | POA: Diagnosis not present

## 2024-02-19 DIAGNOSIS — Z510 Encounter for antineoplastic radiation therapy: Secondary | ICD-10-CM | POA: Diagnosis not present

## 2024-02-19 DIAGNOSIS — C61 Malignant neoplasm of prostate: Secondary | ICD-10-CM | POA: Diagnosis not present

## 2024-02-19 DIAGNOSIS — Z191 Hormone sensitive malignancy status: Secondary | ICD-10-CM | POA: Diagnosis not present

## 2024-02-22 DIAGNOSIS — C61 Malignant neoplasm of prostate: Secondary | ICD-10-CM | POA: Diagnosis not present

## 2024-02-22 DIAGNOSIS — Z510 Encounter for antineoplastic radiation therapy: Secondary | ICD-10-CM | POA: Diagnosis not present

## 2024-02-22 DIAGNOSIS — Z191 Hormone sensitive malignancy status: Secondary | ICD-10-CM | POA: Diagnosis not present

## 2024-02-23 DIAGNOSIS — Z191 Hormone sensitive malignancy status: Secondary | ICD-10-CM | POA: Diagnosis not present

## 2024-02-23 DIAGNOSIS — Z510 Encounter for antineoplastic radiation therapy: Secondary | ICD-10-CM | POA: Diagnosis not present

## 2024-02-23 DIAGNOSIS — C61 Malignant neoplasm of prostate: Secondary | ICD-10-CM | POA: Diagnosis not present

## 2024-02-24 DIAGNOSIS — Z191 Hormone sensitive malignancy status: Secondary | ICD-10-CM | POA: Diagnosis not present

## 2024-02-24 DIAGNOSIS — Z510 Encounter for antineoplastic radiation therapy: Secondary | ICD-10-CM | POA: Diagnosis not present

## 2024-02-24 DIAGNOSIS — C61 Malignant neoplasm of prostate: Secondary | ICD-10-CM | POA: Diagnosis not present

## 2024-02-25 DIAGNOSIS — Z191 Hormone sensitive malignancy status: Secondary | ICD-10-CM | POA: Diagnosis not present

## 2024-02-25 DIAGNOSIS — Z510 Encounter for antineoplastic radiation therapy: Secondary | ICD-10-CM | POA: Diagnosis not present

## 2024-02-25 DIAGNOSIS — C61 Malignant neoplasm of prostate: Secondary | ICD-10-CM | POA: Diagnosis not present

## 2024-02-26 DIAGNOSIS — Z510 Encounter for antineoplastic radiation therapy: Secondary | ICD-10-CM | POA: Diagnosis not present

## 2024-02-26 DIAGNOSIS — Z191 Hormone sensitive malignancy status: Secondary | ICD-10-CM | POA: Diagnosis not present

## 2024-02-26 DIAGNOSIS — C61 Malignant neoplasm of prostate: Secondary | ICD-10-CM | POA: Diagnosis not present

## 2024-03-01 DIAGNOSIS — C61 Malignant neoplasm of prostate: Secondary | ICD-10-CM | POA: Diagnosis not present

## 2024-03-01 DIAGNOSIS — Z191 Hormone sensitive malignancy status: Secondary | ICD-10-CM | POA: Diagnosis not present

## 2024-03-01 DIAGNOSIS — Z510 Encounter for antineoplastic radiation therapy: Secondary | ICD-10-CM | POA: Diagnosis not present

## 2024-03-02 DIAGNOSIS — Z191 Hormone sensitive malignancy status: Secondary | ICD-10-CM | POA: Diagnosis not present

## 2024-03-02 DIAGNOSIS — C61 Malignant neoplasm of prostate: Secondary | ICD-10-CM | POA: Diagnosis not present

## 2024-03-02 DIAGNOSIS — Z510 Encounter for antineoplastic radiation therapy: Secondary | ICD-10-CM | POA: Diagnosis not present

## 2024-03-03 ENCOUNTER — Encounter: Payer: Self-pay | Admitting: Sports Medicine

## 2024-03-03 ENCOUNTER — Ambulatory Visit: Admitting: Sports Medicine

## 2024-03-03 ENCOUNTER — Ambulatory Visit (INDEPENDENT_AMBULATORY_CARE_PROVIDER_SITE_OTHER): Admitting: Sports Medicine

## 2024-03-03 VITALS — Wt 197.0 lb

## 2024-03-03 DIAGNOSIS — Z510 Encounter for antineoplastic radiation therapy: Secondary | ICD-10-CM | POA: Diagnosis not present

## 2024-03-03 DIAGNOSIS — M47816 Spondylosis without myelopathy or radiculopathy, lumbar region: Secondary | ICD-10-CM

## 2024-03-03 DIAGNOSIS — C61 Malignant neoplasm of prostate: Secondary | ICD-10-CM | POA: Diagnosis not present

## 2024-03-03 DIAGNOSIS — Z191 Hormone sensitive malignancy status: Secondary | ICD-10-CM | POA: Diagnosis not present

## 2024-03-03 NOTE — Progress Notes (Signed)
    Procedures performed today:    None.  Independent interpretation of notes and tests performed by another provider:   None.  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 At the last visit about a month and a half ago we started Lyrica  and it seemed to help to some degree. He is currently working with Dr. Eladio Graven. He was advised to go up to Lyrica  3 times daily, has not done this yet. I explained the importance of getting medication to do diligence of dose titration before considering operative intervention, he is not interested in a second opinion, I would like him to touch base with Dr. Jackee Marus. He will also work on a dose titration for his Lyrica .  I spent 30 minutes of total time managing this patient today, this includes chart review, face to face, and non-face to face time.  We talked about the treatment protocol in depth.  ____________________________________________ Joselyn Nicely. Sandy Crumb, M.D., ABFM., CAQSM., AME. Primary Care and Sports Medicine [facility]  Adjunct Professor of [facility] Medicine  [employer]  Restaurant manager, fast food

## 2024-03-03 NOTE — Assessment & Plan Note (Signed)
 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 At the last visit about a month and a half ago we started Lyrica  and it seemed to help to some degree. He is currently working with Dr. Eladio Graven. He was advised to go up to Lyrica  3 times daily, has not done this yet. I explained the importance of getting medication to do diligence of dose titration before considering operative intervention, he is not interested in a second opinion, I would like him to touch base with Dr. Jackee Marus. He will also work on a dose titration for his Lyrica .

## 2024-03-04 DIAGNOSIS — Z510 Encounter for antineoplastic radiation therapy: Secondary | ICD-10-CM | POA: Diagnosis not present

## 2024-03-04 DIAGNOSIS — Z191 Hormone sensitive malignancy status: Secondary | ICD-10-CM | POA: Diagnosis not present

## 2024-03-04 DIAGNOSIS — C61 Malignant neoplasm of prostate: Secondary | ICD-10-CM | POA: Diagnosis not present

## 2024-03-07 DIAGNOSIS — C61 Malignant neoplasm of prostate: Secondary | ICD-10-CM | POA: Diagnosis not present

## 2024-03-07 DIAGNOSIS — Z510 Encounter for antineoplastic radiation therapy: Secondary | ICD-10-CM | POA: Diagnosis not present

## 2024-03-07 DIAGNOSIS — Z191 Hormone sensitive malignancy status: Secondary | ICD-10-CM | POA: Diagnosis not present

## 2024-03-08 DIAGNOSIS — C61 Malignant neoplasm of prostate: Secondary | ICD-10-CM | POA: Diagnosis not present

## 2024-03-08 DIAGNOSIS — Z191 Hormone sensitive malignancy status: Secondary | ICD-10-CM | POA: Diagnosis not present

## 2024-03-08 DIAGNOSIS — Z510 Encounter for antineoplastic radiation therapy: Secondary | ICD-10-CM | POA: Diagnosis not present

## 2024-03-09 DIAGNOSIS — Z510 Encounter for antineoplastic radiation therapy: Secondary | ICD-10-CM | POA: Diagnosis not present

## 2024-03-09 DIAGNOSIS — Z191 Hormone sensitive malignancy status: Secondary | ICD-10-CM | POA: Diagnosis not present

## 2024-03-09 DIAGNOSIS — C61 Malignant neoplasm of prostate: Secondary | ICD-10-CM | POA: Diagnosis not present

## 2024-03-10 DIAGNOSIS — Z510 Encounter for antineoplastic radiation therapy: Secondary | ICD-10-CM | POA: Diagnosis not present

## 2024-03-10 DIAGNOSIS — Z191 Hormone sensitive malignancy status: Secondary | ICD-10-CM | POA: Diagnosis not present

## 2024-03-10 DIAGNOSIS — C61 Malignant neoplasm of prostate: Secondary | ICD-10-CM | POA: Diagnosis not present

## 2024-03-11 DIAGNOSIS — Z510 Encounter for antineoplastic radiation therapy: Secondary | ICD-10-CM | POA: Diagnosis not present

## 2024-03-11 DIAGNOSIS — C61 Malignant neoplasm of prostate: Secondary | ICD-10-CM | POA: Diagnosis not present

## 2024-03-11 DIAGNOSIS — Z191 Hormone sensitive malignancy status: Secondary | ICD-10-CM | POA: Diagnosis not present

## 2024-03-14 DIAGNOSIS — C61 Malignant neoplasm of prostate: Secondary | ICD-10-CM | POA: Diagnosis not present

## 2024-03-14 DIAGNOSIS — Z191 Hormone sensitive malignancy status: Secondary | ICD-10-CM | POA: Diagnosis not present

## 2024-03-14 DIAGNOSIS — Z510 Encounter for antineoplastic radiation therapy: Secondary | ICD-10-CM | POA: Diagnosis not present

## 2024-04-07 ENCOUNTER — Ambulatory Visit

## 2024-04-07 VITALS — BP 120/67 | HR 85 | Ht 73.0 in | Wt 194.0 lb

## 2024-04-07 DIAGNOSIS — Z0000 Encounter for general adult medical examination without abnormal findings: Secondary | ICD-10-CM | POA: Diagnosis not present

## 2024-04-07 NOTE — Progress Notes (Signed)
 Subjective:   Joel Hopkins is a 78 y.o. male who presents for Medicare Annual/Subsequent preventive examination.  Visit Complete: In person  Patient Medicare AWV questionnaire was completed by the patient on n/a; I have confirmed that all information answered by patient is correct and no changes since this date.  Cardiac Risk Factors include: advanced age (>23men, >2 women);male gender;family history of premature cardiovascular disease     Objective:    Today's Vitals   04/07/24 0801 04/07/24 0850  BP: 139/69 120/67  Pulse: 85   SpO2: 99%   Weight: 194 lb (88 kg)   Height: 6' 1 (1.854 m)    Body mass index is 25.6 kg/m.     04/07/2024    8:25 AM 11/05/2023   11:58 AM 03/31/2023    8:10 AM 10/21/2022    9:38 AM 03/26/2022    8:09 AM 10/29/2020    4:07 PM 10/24/2019    9:08 AM  Advanced Directives  Does Patient Have a Medical Advance Directive? Yes Yes Yes Yes Yes Yes Yes  Type of Estate agent of [place];Living will Healthcare Power of [place];Living will Living will Healthcare Power of [place];Living will Living will Healthcare Power of [place];Living will Healthcare Power of [place];Living will  Does patient want to make changes to medical advance directive? No - Patient declined No - Patient declined No - Patient declined No - Patient declined No - Patient declined No - Patient declined No - Patient declined  Copy of Healthcare Power of Attorney in Chart? No - copy requested No - copy requested    No - copy requested No - copy requested  Would patient like information on creating a medical advance directive?      No - Patient declined     Current Medications (verified) Outpatient Encounter Medications as of 04/07/2024  Medication Sig   cetirizine (ZYRTEC) 10 MG tablet Take 10 mg by mouth daily.   levothyroxine  (SYNTHROID ) 88 MCG tablet TAKE ONE TABLET BY MOUTH EVERY MORNING BEFORE BREAKFAST   pantoprazole  (PROTONIX ) 40 MG tablet Take 1 tablet  (40 mg total) by mouth daily. (Patient taking differently: Take 40 mg by mouth daily as needed.)   pregabalin  (LYRICA ) 75 MG capsule 1 capsule p.o. nightly for a week then twice daily for a week then 3 times daily   tamsulosin (FLOMAX) 0.4 MG CAPS capsule Take 0.4 mg by mouth.   albuterol  (PROVENTIL ) (2.5 MG/3ML) 0.083% nebulizer solution Take 3 mLs (2.5 mg total) by nebulization every 6 (six) hours as needed for wheezing or shortness of breath. (Patient not taking: Reported on 04/07/2024)   albuterol  (VENTOLIN  HFA) 108 (90 Base) MCG/ACT inhaler Inhale 2 puffs into the lungs every 6 (six) hours as needed. (Patient not taking: Reported on 04/07/2024)   albuterol  (VENTOLIN  HFA) 108 (90 Base) MCG/ACT inhaler Inhale 2 puffs into the lungs every 6 (six) hours as needed for wheezing or shortness of breath. (Patient not taking: Reported on 04/07/2024)   [DISCONTINUED] benzonatate  (TESSALON ) 200 MG capsule Take 1 capsule (200 mg total) by mouth 2 (two) times daily as needed for cough. (Patient not taking: Reported on 03/03/2024)   [DISCONTINUED] guaiFENesin -codeine  100-10 MG/5ML syrup Take 5 mLs by mouth 3 (three) times daily as needed for cough. (Patient not taking: Reported on 03/03/2024)   [DISCONTINUED] HYDROcodone -acetaminophen  (NORCO/VICODIN) 5-325 MG tablet Take 1 tablet by mouth every 8 (eight) hours as needed for moderate pain. (Patient not taking: Reported on 03/03/2024)   [DISCONTINUED] meclizine  (ANTIVERT ) 25  MG tablet Take 1 tablet (25 mg total) by mouth 3 (three) times daily as needed for dizziness.   [DISCONTINUED] methylPREDNISolone  (MEDROL  DOSEPAK) 4 MG TBPK tablet Follow instructions on pill pack (Patient not taking: Reported on 03/03/2024)   Facility-Administered Encounter Medications as of 04/07/2024  Medication   ipratropium-albuterol  (DUONEB) 0.5-2.5 (3) MG/3ML nebulizer solution 3 mL    Allergies (verified) Patient has no known allergies.   History: Past Medical History:  Diagnosis Date    Hypothyroid 09/21/2012   Past Surgical History:  Procedure Laterality Date   APPENDECTOMY     BACK SURGERY     Cervical Fusion C5-C7   BILATERAL LUMBAR L4-L5 TRANSFORAMINAL EPIDURAL STEROID INJECTION  01/13/2023   [facility] - care everywhere   CATARACT EXTRACTION     CERVICAL EPIDURAL STEROID INJECTION   03/24/2023   [facility] - care everywhere   HERNIA REPAIR     VASECTOMY     Family History  Problem Relation Age of Onset   Colon cancer Sister    Diabetes Brother    Heart disease Mother    Cancer Brother        brain cancer   Heart disease Brother    COPD Brother    Cancer Brother        lung cancer   Social History   Socioeconomic History   Marital status: Married    Spouse name: Roselie   Number of children: 3   Years of education: 12   Highest education level: 12th grade  Occupational History   Occupation: retired    Comment: truck Hospital doctor  Tobacco Use   Smoking status: Never   Smokeless tobacco: Never  Advertising account planner   Vaping status: Never Used  Substance and Sexual Activity   Alcohol use: Not Currently   Drug use: No   Sexual activity: Yes  Other Topics Concern   Not on file  Social History Narrative   He lives with his wife. 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  He enjoys working on his old cars.   Social Drivers of Corporate investment banker Strain: Low Risk  (04/07/2024)   Overall Financial Resource Strain (CARDIA)    Difficulty of Paying Living Expenses: Not hard at all  Food Insecurity: No Food Insecurity (04/07/2024)   Hunger Vital Sign    Worried About Running Out of Food in the Last Year: Never true    Ran Out of Food in the Last Year: Never true  Transportation Needs: No Transportation Needs (04/07/2024)   PRAPARE - Administrator, Civil Service (Medical): No    Lack of Transportation (Non-Medical): No  Physical Activity: Insufficiently Active (04/07/2024)   Exercise Vital Sign    Days of Exercise per Week: 2 days    Minutes of  Exercise per Session: 50 min  Stress: No Stress Concern Present (04/07/2024)   [employer] of Occupational Health - Occupational Stress Questionnaire    Feeling of Stress: Not at all  Social Connections: Socially Integrated (04/07/2024)   Social Connection and Isolation Panel    Frequency of Communication with Friends and Family: More than three times a week    Frequency of Social Gatherings with Friends and Family: More than three times a week    Attends Religious Services: More than 4 times per year    Active Member of [employer] or Organizations: Yes    Attends Engineer, structural: More than 4 times per year    Marital Status: Married  Tobacco Counseling Counseling given: Not Answered   Clinical Intake:  Pre-visit preparation completed: Yes  Pain : No/denies pain     BMI - recorded: 25.6 Nutritional Status: BMI 25 -29 Overweight Nutritional Risks: None Diabetes: No  How often do you need to have someone help you when you read instructions, pamphlets, or other written materials from your doctor or pharmacy?: 1 - Never What is the last grade level you completed in school?: 12  Interpreter Needed?: No      Activities of Daily Living    04/07/2024    8:04 AM  In your present state of health, do you have any difficulty performing the following activities:  Hearing? 1  Comment Hearing aid  Vision? 0  Difficulty concentrating or making decisions? 0  Walking or climbing stairs? 0  Dressing or bathing? 0  Doing errands, shopping? 0  Preparing Food and eating ? N  Using the Toilet? N  In the past six months, have you accidently leaked urine? Y  Do you have problems with loss of bowel control? N  Managing your Medications? N  Managing your Finances? N  Housekeeping or managing your Housekeeping? N    Patient Care Team: Curtis Debby PARAS, MD as PCP - General (Family Medicine) Gerome Evalene RAMAN, MD as Referring Physician (Hematology)  Indicate any  recent Medical Services you may have received from other than [facility] providers in the past year (date may be approximate).     Assessment:   This is a routine wellness examination for [place].  Hearing/Vision screen No results found.   Goals Addressed             This Visit's Progress    Patient Stated       Patient states he would like to continue healthy lifestyle.        Depression Screen    04/07/2024    8:24 AM 10/29/2023    2:46 PM 03/31/2023    8:11 AM 03/26/2022    8:10 AM 11/04/2021   10:45 AM 10/29/2020    4:14 PM 10/24/2019    9:09 AM  PHQ 2/9 Scores  PHQ - 2 Score 0 0 0 0 0 0 0  PHQ- 9 Score      0     Fall Risk    04/07/2024    8:26 AM 10/29/2023    2:45 PM 03/31/2023    8:10 AM 03/26/2022    8:09 AM 11/04/2021   10:45 AM  Fall Risk   Falls in the past year? 0 0 0 1 0  Number falls in past yr: 0 0 0 0 0  Injury with Fall? 0 0 0 0 0  Risk for fall due to : No Fall Risks  No Fall Risks No Fall Risks No Fall Risks  Follow up Falls evaluation completed Falls evaluation completed Falls evaluation completed Falls evaluation completed  Falls evaluation completed      Data saved with a previous flowsheet row definition    MEDICARE RISK AT HOME: Medicare Risk at Home Any stairs in or around the home?: Yes If so, are there any without handrails?: Yes Home free of loose throw rugs in walkways, pet beds, electrical cords, etc?: Yes Adequate lighting in your home to reduce risk of falls?: Yes Life alert?: No Use of a cane, walker or w/c?: No Grab bars in the bathroom?: Yes Shower chair or bench in shower?: Yes Elevated toilet seat or a handicapped toilet?: No  TIMED UP  AND GO:  Was the test performed?  Yes  Length of time to ambulate 10 feet: 9 sec Gait steady and fast without use of assistive device    Cognitive Function:        04/07/2024    8:27 AM 03/31/2023    8:17 AM 03/26/2022    8:16 AM 10/29/2020    4:17 PM 10/24/2019    9:12 AM  6CIT Screen  What  Year? 0 points 0 points 0 points 0 points 0 points  What month? 0 points 0 points 0 points 0 points 0 points  What time? 0 points 0 points 0 points 0 points 0 points  Count back from 20 0 points 0 points 2 points 0 points 0 points  Months in reverse 0 points 2 points 2 points 2 points 0 points  Repeat phrase 0 points 2 points 0 points 0 points 0 points  Total Score 0 points 4 points 4 points 2 points 0 points    Immunizations Immunization History  Administered Date(s) Administered   Fluad Quad(high Dose 65+) 07/06/2021   Influenza, High Dose Seasonal PF 08/04/2019   Influenza,inj,Quad PF,6+ Mos 06/30/2018   Influenza-Unspecified 06/15/2014, 08/07/2019, 07/20/2020   Janssen (J&J) SARS-COV-2 Vaccination 12/18/2019, 09/07/2020   Pneumococcal Conjugate-13 10/15/2015   Pneumococcal Polysaccharide-23 04/09/2017   Tdap 04/09/2017, 05/04/2018   Zoster Recombinant(Shingrix) 06/30/2018, 08/30/2018   Zoster, Live 01/30/2016    TDAP status: Up to date  Flu Vaccine status: Up to date  Pneumococcal vaccine status: Up to date  Covid-19 vaccine status: Completed vaccines  Qualifies for Shingles Vaccine? Yes   Zostavax completed No   Shingrix Completed?: No.    Education has been provided regarding the importance of this vaccine. Patient has been advised to call insurance company to determine out of pocket expense if they have not yet received this vaccine. Advised may also receive vaccine at local pharmacy or Health Dept. Verbalized acceptance and understanding.  Screening Tests Health Maintenance  Topic Date Due   COVID-19 Vaccine (3 - 2024-25 season) 06/07/2023   INFLUENZA VACCINE  05/06/2024   Medicare Annual Wellness (AWV)  04/07/2025   DTaP/Tdap/Td (3 - Td or Tdap) 05/04/2028   Pneumococcal Vaccine: 50+ Years  Completed   Hepatitis C Screening  Completed   Zoster Vaccines- Shingrix  Completed   Hepatitis B Vaccines  Aged Out   HPV VACCINES  Aged Out   Meningococcal B Vaccine   Aged Out   Colonoscopy  Discontinued    Health Maintenance  Health Maintenance Due  Topic Date Due   COVID-19 Vaccine (3 - 2024-25 season) 06/07/2023    Colorectal cancer screening: No longer required.   Lung Cancer Screening: (Low Dose CT Chest recommended if Age 44-80 years, 20 pack-year currently smoking OR have quit w/in 15years.) does not qualify.   Lung Cancer Screening Referral: n/a  Additional Screening:  Hepatitis C Screening: does not qualify; Completed 06/19/2016  Vision Screening: Recommended annual ophthalmology exams for early detection of glaucoma and other disorders of the eye. Is the patient up to date with their annual eye exam?  Yes  Who is the provider or what is the name of the office in which the patient attends annual eye exams? Dr Gaetana  If pt is not established with a provider, would they like to be referred to a provider to establish care? N/a.   Dental Screening: Recommended annual dental exams for proper oral hygiene    Community Resource Referral / Chronic Care Management: CRR  required this visit?  No   CCM required this visit?  No     Plan:     I have personally reviewed and noted the following in the patient's chart:   Medical and social history Use of alcohol, tobacco or illicit drugs  Current medications and supplements including opioid prescriptions. Patient is not currently taking opioid prescriptions. Functional ability and status Nutritional status Physical activity Advanced directives List of other physicians Hospitalizations, surgeries, and ER visits in previous 12 months Vitals Screenings to include cognitive, depression, and falls Referrals and appointments  In addition, I have reviewed and discussed with patient certain preventive protocols, quality metrics, and best practice recommendations. A written personalized care plan for preventive services as well as general preventive health recommendations were provided to  patient.     Bonny Jon Mayor, CMA   04/07/2024   After Visit Summary: (In Person-Printed) AVS printed and given to the patient  Nurse Notes:   TOMISLAV MICALE is a 78 y.o. male patient of Alvia Bring, DO who had a Medicare Annual Wellness Visit today via telephone. Jodey is Retired and lives with their spouse. He has 3 children. He reports that he is socially active and does interact with friends/family regularly.  He is moderately physically active and enjoys working on old cars.

## 2024-04-07 NOTE — Patient Instructions (Signed)
 [name] , Thank you for taking time to come for your Medicare Wellness Visit. I appreciate your ongoing commitment to your health goals. Please review the following plan we discussed and let me know if I can assist you in the future.   These are the goals we discussed:  Goals       DIET - INCREASE WATER INTAKE      Increase water intake to 32 ounces a day.      Exercise 3x per week (30 min per time)      Increase exercise to walking 3 times a week for 30 minutes at a time.      Patient Stated (pt-stated)      10/29/2020 AWV Goal: Exercise for General Health  Patient will verbalize understanding of the benefits of increased physical activity: Exercising regularly is important. It will improve your overall fitness, flexibility, and endurance. Regular exercise also will improve your overall health. It can help you control your weight, reduce stress, and improve your bone density. Over the next year, patient will increase physical activity as tolerated with a goal of at least 150 minutes of moderate physical activity per week.  You can tell that you are exercising at a moderate intensity if your heart starts beating faster and you start breathing faster but can still hold a conversation. Moderate-intensity exercise ideas include: Walking 1 mile (1.6 km) in about 15 minutes Biking Hiking Golfing Dancing Water aerobics Patient will verbalize understanding of everyday activities that increase physical activity by providing examples like the following: Yard work, such as: Insurance underwriter Gardening Washing windows or floors Patient will be able to explain general safety guidelines for exercising:  Before you start a new exercise program, talk with your health care provider. Do not exercise so much that you hurt yourself, feel dizzy, or get very short of breath. Wear comfortable clothes and wear shoes  with good support. Drink plenty of water while you exercise to prevent dehydration or heat stroke. Work out until your breathing and your heartbeat get faster.       Patient Stated      03/26/2022 AWV Goal: Exercise for General Health  Patient will verbalize understanding of the benefits of increased physical activity: Exercising regularly is important. It will improve your overall fitness, flexibility, and endurance. Regular exercise also will improve your overall health. It can help you control your weight, reduce stress, and improve your bone density. Over the next year, patient will increase physical activity as tolerated with a goal of at least 150 minutes of moderate physical activity per week.  You can tell that you are exercising at a moderate intensity if your heart starts beating faster and you start breathing faster but can still hold a conversation. Moderate-intensity exercise ideas include: Walking 1 mile (1.6 km) in about 15 minutes Biking Hiking Golfing Dancing Water aerobics Patient will verbalize understanding of everyday activities that increase physical activity by providing examples like the following: Yard work, such as: Insurance underwriter Gardening Washing windows or floors Patient will be able to explain general safety guidelines for exercising:  Before you start a new exercise program, talk with your health care provider. Do not exercise so much that you hurt yourself, feel dizzy, or get very short of breath. Wear comfortable clothes and wear shoes with good support. Drink  plenty of water while you exercise to prevent dehydration or heat stroke. Work out until your breathing and your heartbeat get faster.       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      Patient Stated      Patient states he  would like to continue healthy lifestyle.         This is a list of the screening recommended for you and due dates:  Health Maintenance  Topic Date Due   COVID-19 Vaccine (3 - 2024-25 season) 06/07/2023   Flu Shot  05/06/2024   Medicare Annual Wellness Visit  04/07/2025   DTaP/Tdap/Td vaccine (3 - Td or Tdap) 05/04/2028   Pneumococcal Vaccine for age over 72  Completed   Hepatitis C Screening  Completed   Zoster (Shingles) Vaccine  Completed   Hepatitis B Vaccine  Aged Out   HPV Vaccine  Aged Out   Meningitis B Vaccine  Aged Out   Colon Cancer Screening  Discontinued

## 2024-04-18 ENCOUNTER — Encounter: Payer: Self-pay | Admitting: Sports Medicine

## 2024-04-18 ENCOUNTER — Ambulatory Visit (INDEPENDENT_AMBULATORY_CARE_PROVIDER_SITE_OTHER): Admitting: Sports Medicine

## 2024-04-18 VITALS — BP 99/59 | HR 87 | Resp 20 | Ht 73.0 in | Wt 200.0 lb

## 2024-04-18 DIAGNOSIS — H8111 Benign paroxysmal vertigo, right ear: Secondary | ICD-10-CM

## 2024-04-18 DIAGNOSIS — H60591 Other noninfective acute otitis externa, right ear: Secondary | ICD-10-CM | POA: Insufficient documentation

## 2024-04-18 DIAGNOSIS — H8110 Benign paroxysmal vertigo, unspecified ear: Secondary | ICD-10-CM | POA: Insufficient documentation

## 2024-04-18 MED ORDER — CIPROFLOXACIN-DEXAMETHASONE 0.3-0.1 % OT SUSP: 4.0000 [drp] | Freq: Two times a day (BID) | OTIC | 0 refills | Status: DC

## 2024-04-18 MED ORDER — NEOMYCIN-POLYMYXIN-HC 1 % OT SOLN: 3.0000 [drp] | Freq: Four times a day (QID) | OTIC | 0 refills | Status: DC

## 2024-04-18 NOTE — Progress Notes (Addendum)
    Procedures performed today:    None.  Independent interpretation of notes and tests performed by another provider:   None.  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 On exam he does have some mild swelling of the right external canal, tympanic membrane looks normal, no obvious fluid. We will treat with Ciprodex . If too expensive we will switch to Cortisporin.  Update: Ciprodex  not formulary, switch to Cortisporin.  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 He is having some increasing dizziness with changes in head position. No focal neurologic symptoms. His wife will help him do the Epley maneuver for now.    ____________________________________________ Joel Hopkins, M.D., ABFM., CAQSM., AME. Primary Care and Sports Medicine [facility]  Adjunct Professor of [facility] Medicine  [employer]  Restaurant manager, fast food

## 2024-04-18 NOTE — Assessment & Plan Note (Signed)
 There is a history of positional vertigo, he had only minimal improvement with vestibular rehab. He is having some increasing dizziness with changes in head position. No focal neurologic symptoms. His wife will help him do the Epley maneuver for now.

## 2024-04-18 NOTE — Assessment & Plan Note (Addendum)
 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 On exam he does have some mild swelling of the right external canal, tympanic membrane looks normal, no obvious fluid. We will treat with Ciprodex . If too expensive we will switch to Cortisporin.  Update: Ciprodex  not formulary, switch to Cortisporin.

## 2024-04-18 NOTE — Addendum Note (Signed)
 Addended by: CURTIS DEBBY PARAS on: 04/18/2024 10:27 AM   Modules accepted: Orders

## 2024-04-19 DIAGNOSIS — C61 Malignant neoplasm of prostate: Secondary | ICD-10-CM | POA: Diagnosis not present

## 2024-06-07 ENCOUNTER — Encounter: Payer: Self-pay | Admitting: Sports Medicine

## 2024-06-15 ENCOUNTER — Other Ambulatory Visit: Payer: Self-pay | Admitting: Family Medicine

## 2024-06-17 ENCOUNTER — Other Ambulatory Visit: Payer: Self-pay

## 2024-06-17 MED ORDER — LEVOTHYROXINE SODIUM 88 MCG PO TABS: 88.0000 ug | ORAL_TABLET | Freq: Every day | ORAL | 0 refills | Status: DC

## 2024-06-20 ENCOUNTER — Telehealth: Payer: Self-pay

## 2024-06-20 NOTE — Telephone Encounter (Signed)
 Copied from CRM [id number]. Topic: Clinical - Prescription Issue >> Jun 17, 2024  2:32 PM Diannia H wrote: Reason for CRM: Patients wife Roselie called and stated that its been two days and she is wondering what's going on with the patients rx for Levothyroxine  Sodium (88 mcg Oral Daily with breakfast) since Dr. ONEIDA is gone. The pharmacy stated that they have been waiting for the provider to respond so they can get this filled for the patient. Could yo assist?   [facility] [place], [place] - [address] [place] [address] Phone: [phone] Fax: [phone] Hours: Not open 24 hours

## 2024-06-20 NOTE — Telephone Encounter (Signed)
 Message sent to patient regarding refill sent and information for scheduling with new provider.

## 2024-06-23 ENCOUNTER — Ambulatory Visit (INDEPENDENT_AMBULATORY_CARE_PROVIDER_SITE_OTHER): Admitting: Medical-Surgical

## 2024-06-23 ENCOUNTER — Encounter: Payer: Self-pay | Admitting: Medical-Surgical

## 2024-06-23 VITALS — BP 108/61 | HR 55 | Resp 20 | Ht 73.0 in | Wt 201.0 lb

## 2024-06-23 DIAGNOSIS — M47816 Spondylosis without myelopathy or radiculopathy, lumbar region: Secondary | ICD-10-CM

## 2024-06-23 DIAGNOSIS — E039 Hypothyroidism, unspecified: Secondary | ICD-10-CM

## 2024-06-23 DIAGNOSIS — C61 Malignant neoplasm of prostate: Secondary | ICD-10-CM | POA: Diagnosis not present

## 2024-06-23 DIAGNOSIS — R7303 Prediabetes: Secondary | ICD-10-CM | POA: Diagnosis not present

## 2024-06-23 DIAGNOSIS — H9201 Otalgia, right ear: Secondary | ICD-10-CM | POA: Insufficient documentation

## 2024-06-23 MED ORDER — LEVOTHYROXINE SODIUM 88 MCG PO TABS: 88.0000 ug | ORAL_TABLET | Freq: Every day | ORAL | 3 refills | Status: DC

## 2024-06-23 MED ORDER — PREGABALIN 100 MG PO CAPS: 100.0000 mg | ORAL_CAPSULE | Freq: Two times a day (BID) | ORAL | 3 refills | Status: AC

## 2024-06-23 NOTE — Progress Notes (Signed)
 Established patient visit   History of Present Illness   Discussed the use of AI scribe software for clinical note transcription with the patient, who gave verbal consent to proceed.  History of Present Illness   Joel Hopkins is a 78 year old male who presents with right ear pain and for blood work to check thyroid  and A1c levels.  Right otalgia and auditory symptoms - Right ear pain for 2-3 weeks - Associated with popping, clicking, and noises in the right ear - Suspects pressure from hearing aid contributes to discomfort - No symptoms in the left ear  Glycemic status and thyroid  function monitoring - Here for blood work to check thyroid  and A1c levels - Last A1c in prediabetic range - Takes levothyroxine  88 mcg daily for thyroid  management - Thyroid  levels have fluctuated but remain within normal range  Chronic pain and mobility impairment - Takes Lyrica  75 mg twice daily for pain management but finds it insufficient - Prefers twice-daily dosing of Lyrica  rather than 3 times daily - Also takes Tylenol  for pain - Significant mobility difficulty, especially after lying down, due to lower back stiffness     Prostate cancer - Sees oncology with regular follow-ups - Taking Orgovyx as prescribed - Feels this medication contributes to generalized pain, fatigue, and activity intolerance  Physical Exam   Physical Exam Vitals and nursing note reviewed.  Constitutional:      General: He is not in acute distress.    Appearance: Normal appearance. He is not ill-appearing.  HENT:     Head: Normocephalic and atraumatic.     Right Ear: Tympanic membrane, ear canal and external ear normal. There is no impacted cerumen.     Left Ear: Tympanic membrane, ear canal and external ear normal. There is no impacted cerumen.  Cardiovascular:     Rate and Rhythm: Normal rate and regular rhythm.     Pulses: Normal pulses.     Heart sounds: Normal heart sounds. No murmur  heard.    No friction rub. No gallop.  Pulmonary:     Effort: Pulmonary effort is normal. No respiratory distress.     Breath sounds: Normal breath sounds.  Skin:    General: Skin is warm and dry.  Neurological:     Mental Status: He is alert and oriented to person, place, and time.  Psychiatric:        Mood and Affect: Mood normal.        Behavior: Behavior normal.        Thought Content: Thought content normal.        Judgment: Judgment normal.    Assessment & Plan   Problem List Items Addressed This Visit       Endocrine   Hypothyroid - Primary (Chronic)   Fluctuating thyroid  levels. Last test within normal range but had dropped significantly. - Order thyroid  function test. - Continue current levothyroxine  88 mcg daily until test results are available.      Relevant Medications   levothyroxine  (SYNTHROID ) 88 MCG tablet   Other Relevant Orders   TSH     Musculoskeletal and Integument   Lumbar spondylosis   Current Lyrica  75 mg twice daily insufficient for pain management, requires Tylenol  use. Prefers twice daily dosing. - Increase Lyrica  to 100 mg twice daily. - Reassess pain control in 3-4 weeks with in-person visit. - Okay to use Tylenol  as needed.      Relevant Medications  pregabalin  (LYRICA ) 100 MG capsule     Genitourinary   Prostate cancer (HCC)   Actively monitored by oncology.  Per report, will be off Orgovyx soon and hoping to feel better with lessened generalized pain and increased activity tolerance. -Follow-up with oncology as scheduled.      Relevant Medications   ORGOVYX 120 MG tablet     Other   Otalgia of right ear   Several weeks of right ear pain accompanied by popping and clicking noises.  Wears hearing aids regularly.  Possible irritation from hearing aid pressure. - Exam benign with no findings to explain right ear pain. - Suspect pressure from hearing aid given the significant curvature of the ear canal.      Prediabetes    Previous A1c was 5.7% placing him in the prediabetic range.  Has not been rechecked recently. - Order A1c test.       Relevant Orders   Hemoglobin A1c   Follow up   Return in about 4 weeks (around 07/21/2024) for Lyrica  follow up. __________________________________ Zada FREDRIK Palin, DNP, APRN, FNP-BC Primary Care and Sports Medicine [facility] [place]

## 2024-06-23 NOTE — Assessment & Plan Note (Signed)
 Current Lyrica  75 mg twice daily insufficient for pain management, requires Tylenol  use. Prefers twice daily dosing. - Increase Lyrica  to 100 mg twice daily. - Reassess pain control in 3-4 weeks with in-person visit. - Okay to use Tylenol  as needed.

## 2024-06-23 NOTE — Assessment & Plan Note (Signed)
 Several weeks of right ear pain accompanied by popping and clicking noises.  Wears hearing aids regularly.  Possible irritation from hearing aid pressure. - Exam benign with no findings to explain right ear pain. - Suspect pressure from hearing aid given the significant curvature of the ear canal.

## 2024-06-23 NOTE — Assessment & Plan Note (Signed)
 Previous A1c was 5.7% placing him in the prediabetic range.  Has not been rechecked recently. - Order A1c test.

## 2024-06-23 NOTE — Assessment & Plan Note (Signed)
 Actively monitored by oncology.  Per report, will be off Orgovyx soon and hoping to feel better with lessened generalized pain and increased activity tolerance. -Follow-up with oncology as scheduled.

## 2024-06-23 NOTE — Assessment & Plan Note (Signed)
 Fluctuating thyroid  levels. Last test within normal range but had dropped significantly. - Order thyroid  function test. - Continue current levothyroxine  88 mcg daily until test results are available.

## 2024-06-24 ENCOUNTER — Ambulatory Visit: Payer: Self-pay | Admitting: Medical-Surgical

## 2024-06-24 DIAGNOSIS — E039 Hypothyroidism, unspecified: Secondary | ICD-10-CM

## 2024-06-24 LAB — HEMOGLOBIN A1C
Est. average glucose Bld gHb Est-mCnc: 111 mg/dL
Hgb A1c MFr Bld: 5.5 % (ref 4.8–5.6)

## 2024-06-24 LAB — TSH: TSH: 4.67 u[IU]/mL — ABNORMAL HIGH (ref 0.450–4.500)

## 2024-06-24 MED ORDER — LEVOTHYROXINE SODIUM 100 MCG PO TABS: 100.0000 ug | ORAL_TABLET | Freq: Every day | ORAL | 3 refills | Status: DC

## 2024-07-20 NOTE — Progress Notes (Signed)
 Established patient visit   History of Present Illness   Discussed the use of AI scribe software for clinical note transcription with the patient, who gave verbal consent to proceed.  History of Present Illness   Joel Hopkins is a 78 year old male who presents with ongoing nerve pain and recent injections for trigger finger.  Cervical radiculopathy and neuropathic symptoms - Numbness and tingling in the right arm and hand, worsened by pressure on a specific spot in the neck - Right arm feels 'asleep' with neck pressure - Difficulty making a fist with the right hand, worsened since receiving neck injections - History of cervical spine pathology, including C5-C6 anterior fusion and posterior disc bone spur - CT scan in January 2024 demonstrated foraminal narrowing - 1 month ago, increased Lyrica  to 100 mg twice daily  - Lyrica  at a higher dose provides symptomatic relief unless engaging in strenuous activities  Trigger finger and injection-related pain - Recent injections in two fingers and the wrist for trigger finger - Painful but necessary injections - Multiple injections over the past year, leading to a feelings of being a 'pin cushion'  Lower back pain - Significant lower back pain, especially when rising from a seated position or getting into a car - Pain described as 'rubbing two boards together' - Previous nerve burning procedures have provided some relief - Finds Lyrica  100 mg twice daily to be helpful    Physical Exam   Physical Exam Vitals reviewed.  Constitutional:      Joel: He is not in acute distress.    Appearance: Normal appearance. He is not ill-appearing.  HENT:     Head: Normocephalic and atraumatic.  Cardiovascular:     Rate and Rhythm: Normal rate and regular rhythm.     Pulses: Normal pulses.     Heart sounds: Normal heart sounds. No murmur heard.    No friction rub. No gallop.  Pulmonary:     Effort: Pulmonary effort is normal.  No respiratory distress.     Breath sounds: Normal breath sounds.  Skin:    Joel: Skin is warm and dry.  Neurological:     Mental Status: He is alert and oriented to person, place, and time.  Psychiatric:        Mood and Affect: Mood normal.        Behavior: Behavior normal.        Thought Content: Thought content normal.        Judgment: Judgment normal.    Assessment & Plan   Problem List Items Addressed This Visit       Endocrine   Hypothyroid (Chronic)   Recent change in levothyroxine  dose to 100 mcg daily.  Recheck of thyroid  labs scheduled for October 30th. - Ensure thyroid  labs are completed on October 30th, printed lab requisition provided.        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 CT shows posterior disc bone spur, disc osteophyte ridge, foraminal narrowing, possible C5-C6 involvement. Discussed nerve regrowth and irritation. - Prescribe prednisone  50 mg Ehly for 5 days to reduce nerve inflammation. - Continue Lyrica  100 mg twice daily.        Musculoskeletal and Integument   Lumbar spondylosis - Primary   Increase in Lyrica  dosing approximately 4 weeks ago has been helpful.  Still using Tylenol  as needed.  Happy with current dose. - Continue Lyrica   100 mg twice daily. - Okay to use Tylenol  as needed. - Avoid strenuous activities known to worsen back pain.      Trigger finger of left hand   Trigger finger and carpal tunnel syndrome with recent injections. Symptoms include hand drawing and difficulty making a fist, possibly linked to cervical issues. - Monitor response to recent injections. - Consider further injections if symptoms persist. - Follow-up with orthopedic surgery as instructed.      Other Visit Diagnoses       Need for influenza vaccination       Relevant Orders   Flu vaccine HIGH DOSE PF(Fluzone Trivalent) (Completed)      Follow up   Return in about 3  months (around 10/21/2024) for chronic disease follow up. __________________________________ Zada FREDRIK Palin, DNP, APRN, FNP-BC Primary Care and Sports Medicine [facility]

## 2024-07-21 ENCOUNTER — Ambulatory Visit (INDEPENDENT_AMBULATORY_CARE_PROVIDER_SITE_OTHER): Admitting: Medical-Surgical

## 2024-07-21 ENCOUNTER — Encounter: Payer: Self-pay | Admitting: Medical-Surgical

## 2024-07-21 VITALS — BP 115/56 | HR 67 | Resp 20 | Ht 73.0 in | Wt 205.1 lb

## 2024-07-21 DIAGNOSIS — M47816 Spondylosis without myelopathy or radiculopathy, lumbar region: Secondary | ICD-10-CM | POA: Diagnosis not present

## 2024-07-21 DIAGNOSIS — Z23 Encounter for immunization: Secondary | ICD-10-CM

## 2024-07-21 DIAGNOSIS — M6530 Trigger finger, unspecified finger: Secondary | ICD-10-CM

## 2024-07-21 DIAGNOSIS — E039 Hypothyroidism, unspecified: Secondary | ICD-10-CM

## 2024-07-21 DIAGNOSIS — M5412 Radiculopathy, cervical region: Secondary | ICD-10-CM | POA: Diagnosis not present

## 2024-07-21 MED ORDER — PREDNISONE 50 MG PO TABS: 50.0000 mg | ORAL_TABLET | Freq: Every day | ORAL | 0 refills | Status: DC

## 2024-07-22 DIAGNOSIS — M6530 Trigger finger, unspecified finger: Secondary | ICD-10-CM | POA: Insufficient documentation

## 2024-07-22 NOTE — Assessment & Plan Note (Signed)
 Trigger finger and carpal tunnel syndrome with recent injections. Symptoms include hand drawing and difficulty making a fist, possibly linked to cervical issues. - Monitor response to recent injections. - Consider further injections if symptoms persist. - Follow-up with orthopedic surgery as instructed.

## 2024-07-22 NOTE — Assessment & Plan Note (Signed)
 Recent change in levothyroxine  dose to 100 mcg daily.  Recheck of thyroid  labs scheduled for October 30th. - Ensure thyroid  labs are completed on October 30th, printed lab requisition provided.

## 2024-07-22 NOTE — Assessment & Plan Note (Signed)
 Increase in Lyrica  dosing approximately 4 weeks ago has been helpful.  Still using Tylenol  as needed.  Happy with current dose. - Continue Lyrica  100 mg twice daily. - Okay to use Tylenol  as needed. - Avoid strenuous activities known to worsen back pain.

## 2024-07-22 NOTE — Assessment & Plan Note (Signed)
 Chronic right cervical radiculopathy with numbness and tingling. CT shows posterior disc bone spur, disc osteophyte ridge, foraminal narrowing, possible C5-C6 involvement. Discussed nerve regrowth and irritation. - Prescribe prednisone  50 mg Ehly for 5 days to reduce nerve inflammation. - Continue Lyrica  100 mg twice daily.

## 2024-08-06 ENCOUNTER — Encounter: Payer: Self-pay | Admitting: Medical-Surgical

## 2024-08-06 DIAGNOSIS — E039 Hypothyroidism, unspecified: Secondary | ICD-10-CM

## 2024-08-06 LAB — TSH: TSH: 5.17 u[IU]/mL — ABNORMAL HIGH (ref 0.450–4.500)

## 2024-08-08 MED ORDER — LEVOTHYROXINE SODIUM 112 MCG PO TABS: 112.0000 ug | ORAL_TABLET | Freq: Every day | ORAL | 1 refills | Status: DC

## 2024-08-24 DIAGNOSIS — M65332 Trigger finger, left middle finger: Secondary | ICD-10-CM | POA: Diagnosis not present

## 2024-08-24 DIAGNOSIS — M65342 Trigger finger, left ring finger: Secondary | ICD-10-CM | POA: Diagnosis not present

## 2024-08-24 DIAGNOSIS — M65322 Trigger finger, left index finger: Secondary | ICD-10-CM | POA: Diagnosis not present

## 2024-08-24 DIAGNOSIS — G5602 Carpal tunnel syndrome, left upper limb: Secondary | ICD-10-CM | POA: Diagnosis not present

## 2024-08-30 DIAGNOSIS — M5412 Radiculopathy, cervical region: Secondary | ICD-10-CM | POA: Diagnosis not present

## 2024-08-30 DIAGNOSIS — M5030 Other cervical disc degeneration, unspecified cervical region: Secondary | ICD-10-CM | POA: Diagnosis not present

## 2024-08-30 DIAGNOSIS — M47816 Spondylosis without myelopathy or radiculopathy, lumbar region: Secondary | ICD-10-CM | POA: Diagnosis not present

## 2024-08-30 DIAGNOSIS — M4802 Spinal stenosis, cervical region: Secondary | ICD-10-CM | POA: Diagnosis not present

## 2024-09-14 DIAGNOSIS — M5136 Other intervertebral disc degeneration, lumbar region with discogenic back pain only: Secondary | ICD-10-CM | POA: Diagnosis not present

## 2024-09-14 DIAGNOSIS — G894 Chronic pain syndrome: Secondary | ICD-10-CM | POA: Diagnosis not present

## 2024-09-14 DIAGNOSIS — M47816 Spondylosis without myelopathy or radiculopathy, lumbar region: Secondary | ICD-10-CM | POA: Diagnosis not present

## 2024-09-14 DIAGNOSIS — M4722 Other spondylosis with radiculopathy, cervical region: Secondary | ICD-10-CM | POA: Diagnosis not present

## 2024-09-15 ENCOUNTER — Ambulatory Visit: Payer: Self-pay | Admitting: Medical-Surgical

## 2024-09-15 DIAGNOSIS — E039 Hypothyroidism, unspecified: Secondary | ICD-10-CM

## 2024-09-15 LAB — TSH: TSH: 0.412 u[IU]/mL — ABNORMAL LOW (ref 0.450–4.500)

## 2024-10-13 ENCOUNTER — Ambulatory Visit: Payer: Self-pay

## 2024-10-13 NOTE — Telephone Encounter (Signed)
 Patient shows scheduled tomorrow 10/14/2024 with Joel Hopkins

## 2024-10-13 NOTE — Telephone Encounter (Signed)
 FYI Only or Action Required?: FYI only for provider: appointment scheduled on 10/14/24.  Patient was last seen in primary care on 07/21/2024 by Joel Mini, NP.  Called Nurse Triage reporting Hip Pain.  Symptoms began several days ago.  Interventions attempted: Prescription medications: lyrica  and Rest, hydration, or home remedies.  Symptoms are: gradually worsening.  Triage Disposition: See HCP Within 4 Hours (Or PCP Triage)  Patient/caregiver understands and will follow disposition?: Yes  Copied from CRM #[id number]. Topic: Clinical - Red Word Triage >> Oct 13, 2024  1:18 PM Geneva B wrote: Red Word that prompted transfer to Nurse Triage: patient has pain in his left hip Reason for Disposition  [1] SEVERE pain (e.g., excruciating, unable to do any normal activities) AND [2] not improved after 2 hours of pain medicine  Answer Assessment - Initial Assessment Questions Patient with 4 days of hip pain- started after going to the gym and walking a mile and a half with his wife. No previous issues walking. Does have history of lower back pain and is scheduled for Nerve ablation 1/20.  Hip pain is moderate-severe- worse with activity or trying to turn leg to the left. Wife denies redness, swelling, knots, fever, temp change, SOB, or CP.  Wife watching him go down stairs to ensure he doesn't fall.  can't get comfortable in bed. Pain occasionally shoots down his leg with numbness. None currently. S  Rubbing CBD oil and Blue Emu (like a Bengay but doesn't stink) to the area. Lyrica  100mg  BID- no real effect.   Appt with PCP office for acute visit tomorrow. ED precautions understood.    1. LOCATION and RADIATION: Where is the pain located? Does the pain spread (shoot) anywhere else?     Left hip  2. QUALITY: What does the pain feel like?  (e.g., sharp, dull, aching, burning)     Sharp when trying to use it 3. SEVERITY: How bad is the pain? What does it keep you from doing?   (Scale  1-10; or mild, moderate, severe)     12/10 pain  4. ONSET: When did the pain start? Does it come and go, or is it there all the time?     This week  5. WORK OR EXERCISE: Has there been any recent work or exercise that involved this part of the body?      Walked a mile and a half at [employer]- no issues with in the past  6. CAUSE: What do you think is causing the hip pain?      Unsure if activity  7. AGGRAVATING FACTORS: What makes the hip pain worse? (e.g., walking, climbing stairs, running)     Any movement or laying down worsens 8. OTHER SYMPTOMS: Do you have any other symptoms? (e.g., back pain, pain shooting down leg,  fever, rash)     Sometimes pain radiates down left leg with numbness.  Protocols used: Hip Pain-A-AH

## 2024-10-14 ENCOUNTER — Ambulatory Visit: Admitting: Family Medicine

## 2024-10-14 ENCOUNTER — Encounter: Payer: Self-pay | Admitting: Family Medicine

## 2024-10-14 ENCOUNTER — Ambulatory Visit

## 2024-10-14 VITALS — BP 130/76 | HR 71 | Ht 73.0 in | Wt 213.0 lb

## 2024-10-14 DIAGNOSIS — M533 Sacrococcygeal disorders, not elsewhere classified: Secondary | ICD-10-CM | POA: Diagnosis not present

## 2024-10-14 MED ORDER — PREDNISONE 10 MG (48) PO TBPK: ORAL_TABLET | Freq: Every day | ORAL | 0 refills | Status: AC

## 2024-10-14 MED ORDER — METHOCARBAMOL 500 MG PO TABS: 500.0000 mg | ORAL_TABLET | Freq: Three times a day (TID) | ORAL | 0 refills | Status: DC | PRN

## 2024-10-14 NOTE — Assessment & Plan Note (Signed)
 SI joint pain as well as lumbar paraspinal spasm.  Start prednisone  taper with robaxin .  Xrays of Sacrum/coccyx ordered.

## 2024-10-14 NOTE — Progress Notes (Signed)
 " Joel Hopkins - 79 y.o. male MRN [id number]  Date of birth: August 21, 1946  Subjective Chief Complaint  Patient presents with   Hip Pain    HPI Joel Hopkins is a 79 y.o. male here today with complaint of posterior hip pain.  This started a few days ago.  He has history of lumbar facet arthropathy and has planned RFA with Dr. Rosella in a couple of weeks.  His pain is in the R posterior hip.  Some radiation into buttock.  No radiation down the leg.  No new numbness or tingling. .  So far he has tried roll on CBD product and Blue Emu without much relief.   ROS:  A comprehensive ROS was completed and negative except as noted per HPI  Allergies[1]  Past Medical History:  Diagnosis Date   H/O colonoscopy 08/11/2013   Internal hemorrhoids, diverticulosis, no polyp.  Repeat 2024     Hypothyroid 09/21/2012    Past Surgical History:  Procedure Laterality Date   APPENDECTOMY     BACK SURGERY     Cervical Fusion C5-C7   BILATERAL LUMBAR L4-L5 TRANSFORAMINAL EPIDURAL STEROID INJECTION  01/13/2023   [facility] - care everywhere   CATARACT EXTRACTION     CERVICAL EPIDURAL STEROID INJECTION   03/24/2023   [facility] - care everywhere   HERNIA REPAIR     VASECTOMY      Social History   Socioeconomic History   Marital status: Married    Spouse name: Roselie   Number of children: 3   Years of education: 12   Highest education level: 12th grade  Occupational History   Occupation: retired    Comment: truck hospital doctor  Tobacco Use   Smoking status: Never   Smokeless tobacco: Never  Vaping Use   Vaping status: Never Used  Substance and Sexual Activity   Alcohol use: Not Currently   Drug use: No   Sexual activity: Yes  Other Topics Concern   Not on file  Social History Narrative   He lives with his wife. 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  He enjoys working on his old cars.   Social Drivers of Health   Tobacco Use: Unknown (09/14/2024)   Received from [facility]    Patient History    Smoking Tobacco Use: Never    Smokeless Tobacco Use: Unknown    Passive Exposure: Not on file  Financial Resource Strain: Low Risk (04/07/2024)   Overall Financial Resource Strain (CARDIA)    Difficulty of Paying Living Expenses: Not hard at all  Food Insecurity: No Food Insecurity (09/08/2024)   Received from [facility]   Epic    Within the past 12 months, you worried that your food would run out before you got the money to buy more.: Never true    Within the past 12 months, the food you bought just didn't last and you didn't have money to get more.: Never true  Transportation Needs: No Transportation Needs (09/08/2024)   Received from [facility]    In the past 12 months, has lack of transportation kept you from medical appointments or from getting medications?: No    In the past 12 months, has lack of transportation kept you from meetings, work, or from getting things needed for daily living?: No  Physical Activity: Insufficiently Active (04/07/2024)   Exercise Vital Sign    Days of Exercise per Week: 2 days    Minutes of Exercise per Session: 50  min  Stress: No Stress Concern Present (04/07/2024)   [employer] of Occupational Health - Occupational Stress Questionnaire    Feeling of Stress: Not at all  Social Connections: Socially Integrated (04/07/2024)   Social Connection and Isolation Panel    Frequency of Communication with Friends and Family: More than three times a week    Frequency of Social Gatherings with Friends and Family: More than three times a week    Attends Religious Services: More than 4 times per year    Active Member of Clubs or Organizations: Yes    Attends Banker Meetings: More than 4 times per year    Marital Status: Married  Depression (PHQ2-9): Low Risk (07/21/2024)   Depression (PHQ2-9)    PHQ-2 Score: 4  Recent Concern: Depression (PHQ2-9) - Medium Risk (06/23/2024)   Depression (PHQ2-9)    PHQ-2 Score: 6   Alcohol Screen: Low Risk (04/07/2024)   Alcohol Screen    Last Alcohol Screening Score (AUDIT): 0  Housing: Low Risk (09/08/2024)   Received from [facility]    In the last 12 months, was there a time when you were not able to pay the mortgage or rent on time?: No    In the past 12 months, how many times have you moved where you were living?: 0    At any time in the past 12 months, were you homeless or living in a shelter (including now)?: No  Utilities: Not At Risk (09/08/2024)   Received from [facility]    In the past 12 months has the electric, gas, oil, or water company threatened to shut off services in your home?: No  Health Literacy: Adequate Health Literacy (04/07/2024)   B1300 Health Literacy    Frequency of need for help with medical instructions: Never    Family History  Problem Relation Age of Onset   Colon cancer Sister    Diabetes Brother    Heart disease Mother    Cancer Brother        brain cancer   Heart disease Brother    COPD Brother    Cancer Brother        lung cancer    Health Maintenance  Topic Date Due   COVID-19 Vaccine (3 - 2025-26 season) 06/06/2024   Medicare Annual Wellness (AWV)  04/07/2025   DTaP/Tdap/Td (3 - Td or Tdap) 05/04/2028   Pneumococcal Vaccine: 50+ Years  Completed   Influenza Vaccine  Completed   Hepatitis C Screening  Completed   Zoster Vaccines- Shingrix  Completed   Meningococcal B Vaccine  Aged Out   Mammogram  Discontinued   Colonoscopy  Discontinued     ----------------------------------------------------------------------------------------------------------------------------------------------------------------------------------------------------------------- Physical Exam BP 130/76 (BP Location: Right Arm, Patient Position: Sitting, Cuff Size: Large)   Pulse 71   Ht 6' 1 (1.854 m)   Wt 213 lb (96.6 kg)   SpO2 96%   BMI 28.10 kg/m   Physical Exam Constitutional:      Appearance: Normal  appearance.  Eyes:     General: No scleral icterus. Cardiovascular:     Rate and Rhythm: Normal rate and regular rhythm.  Pulmonary:     Effort: Pulmonary effort is normal.     Breath sounds: Normal breath sounds.  Musculoskeletal:     Comments: TTP along R SI joint.  Pain with flexion of L thigh and standing. Spasm of lumbar paraspinals noted.   Neurological:     General: No  focal deficit present.     Mental Status: He is alert.  Psychiatric:        Mood and Affect: Mood normal.        Behavior: Behavior normal.     ------------------------------------------------------------------------------------------------------------------------------------------------------------------------------------------------------------------- Assessment and Plan  Sacroiliac pain SI joint pain as well as lumbar paraspinal spasm.  Start prednisone  taper with robaxin .  Xrays of Sacrum/coccyx ordered.    Meds ordered this encounter  Medications   predniSONE  (STERAPRED UNI-PAK 48 TAB) 10 MG (48) TBPK tablet    Sig: Take by mouth daily. 12-day taper pack, use as directed for taper    Dispense:  48 tablet    Refill:  0   methocarbamol  (ROBAXIN ) 500 MG tablet    Sig: Take 1 tablet (500 mg total) by mouth every 8 (eight) hours as needed for muscle spasms.    Dispense:  90 tablet    Refill:  0    No follow-ups on file.         [1] No Known Allergies  "

## 2024-10-14 NOTE — Patient Instructions (Signed)
 Sacroiliac Joint Dysfunction: What to Know  Sacroiliac joint dysfunction causes swelling in one or both sides of the sacroiliac (SI) joint. The SI joint is where two bones in the pelvis meet. The two bones are the sacrum, which is the bone at the bottom of your spine, and the ilium, which is the big bone that makes up your hip. SI joint dysfunction can cause a deep ache or burning pain in your lower back. Sometimes, the pain spreads to your butt, hips, or thighs. What are the causes? SI joint dysfunction may be caused by: Pregnancy. When a person is pregnant, extra pressure is put on the SI joints because the pelvis gets wider. Injuries, such as: Car accidents Sports injuries. Work injuries. Having one leg that is shorter than the other. Conditions that affect the joints, such as: Rheumatoid arthritis. Gout. Psoriatic arthritis. Joint infection. Sometimes, the cause of SI joint dysfunction is not known. What are the signs or symptoms? Symptoms of this condition include: Aching or burning pain in the lower back. The pain may also spread to other areas, such as: Butt. Groin. Thighs. Muscle spasms in or around the painful areas. More pain when standing, walking, running, stair climbing, bending, or lifting. How is this diagnosed? Your health care provider will check your medical history and do a physical exam. During the exam, your provider may move your legs to see if it hurts. They may also do tests like: Imaging tests to look for other causes of pain. These may include: MRI. CT scan. Bone scan. Diagnostic injection. Your provider puts numbing medicine into the SI joint. If your pain gets better after this, it may mean you have this condition. How is this treated? Treatment depends on what is causing the problem and how bad it is. Some options include: Ice or heat applied to the lower back area after an injury. This may help reduce pain and muscle spasms. Medicines to help with  pain and swelling, or to relax the muscles. Wearing a back brace, called an SI brace, to support your back while it heals. Physical therapy to help the muscles around the joint be more strong and improve movement. You may also learn how to move your body in ways that are easier on the joint. Direct manipulation. A therapist may gently move the SI joint to help it work better. Using a device that provides electrical stimulation to help reduce pain at the joint. Other treatments may include: Steroid injections. These can help reduce pain and swelling in the joint. Radiofrequency ablation. This uses heat to burn away nerves that are carrying pain messages from the joint. Surgery to put in screws and plates that limit or prevent joint motion. This is rare. Follow these instructions at home: Medicines Take your medicines only as told. You may need to take these steps to help prevent or treat trouble pooping (constipation): Take medicines to help you poop. Eat foods high in fiber, like beans, whole grains, and fresh fruits and vegetables. Drink more fluids as told. Ask your provider if it's safe to drive or use machines while taking your medicine. If you have a brace: Wear the brace as told. Take it off only if your provider says you can. Keep the brace clean. If the brace is not waterproof: Do not let it get wet. Managing pain, stiffness, and swelling     Icing can help with pain and swelling. Heat may help with muscle tension or spasms. Ask your provider if you  should use ice or heat. Use ice or an ice pack as told. Place a towel between your skin and the ice. Leave the ice on for 20 minutes, 2-3 times a day. Use heat as told. Use the heat source that your provider recommends, such as a moist heat pack or a heating pad. Do this as often as told. Place a towel between your skin and the heat source. Leave the heat on for 20-30 minutes. If your skin turns red, take off the ice or heat  right away to prevent skin damage. The risk of damage is higher if you can't feel pain, heat, or cold. General instructions Rest as told. Ask what things are safe for you to do at home. Ask when you can go back to work or school. Exercise as told. Contact a health care provider if: Your pain is not controlled with medicine. You have a fever. Your pain is getting worse. Get help right away if: You have weakness, numbness, or tingling in your legs or feet. You lose control of your bladder or bowels. This information is not intended to replace advice given to you by your health care provider. Make sure you discuss any questions you have with your health care provider. Document Revised: 08/28/2023 Document Reviewed: 08/28/2023 Elsevier Patient Education  2025 [employer].

## 2024-10-21 ENCOUNTER — Ambulatory Visit: Payer: Self-pay | Admitting: Family Medicine

## 2024-10-21 ENCOUNTER — Ambulatory Visit: Admitting: Medical-Surgical

## 2024-10-24 ENCOUNTER — Encounter: Payer: Self-pay | Admitting: Medical-Surgical

## 2024-10-24 ENCOUNTER — Ambulatory Visit: Admitting: Medical-Surgical

## 2024-10-24 VITALS — BP 107/61 | HR 77 | Ht 73.0 in | Wt 210.7 lb

## 2024-10-24 DIAGNOSIS — N1831 Chronic kidney disease, stage 3a: Secondary | ICD-10-CM

## 2024-10-24 DIAGNOSIS — C61 Malignant neoplasm of prostate: Secondary | ICD-10-CM

## 2024-10-24 DIAGNOSIS — E039 Hypothyroidism, unspecified: Secondary | ICD-10-CM

## 2024-10-24 DIAGNOSIS — R7303 Prediabetes: Secondary | ICD-10-CM | POA: Diagnosis not present

## 2024-10-24 MED ORDER — LEVOTHYROXINE SODIUM 100 MCG PO TABS: 100.0000 ug | ORAL_TABLET | Freq: Every day | ORAL | Status: AC

## 2024-10-24 NOTE — Progress Notes (Signed)
" ° °       Established patient visit   History of Present Illness   Discussed the use of AI scribe software for clinical note transcription with the patient, who gave verbal consent to proceed.  History of Present Illness   Joel Hopkins is a 79 year old male with prostate cancer and hypothyroidism who presents for follow-up on his medications and ongoing symptoms.  Left hip and leg pain - Ongoing pain localized to the left hip with greater discomfort in the left leg compared to the right - Pain interferes with turning over in bed at night - Concern regarding inadequate effectiveness of prior treatments  Hypothyroidism - Treated with levothyroxine  - Recent dose increase from 88 mcg to 100 mcg daily - Feels well on the current dose  Pregabalin  and methocarbamol  use - Takes pregabalin  100 mg, reduced to once daily as he weans off - Prescribed methocarbamol  but has only used it twice due to impaired ability to drive  Prostate cancer surveillance - Followed by oncology every three months - Recent blood draw two weeks ago showed stable disease     Physical Exam   Physical Exam Vitals reviewed.  Constitutional:      General: He is not in acute distress.    Appearance: Normal appearance. He is not ill-appearing.  HENT:     Head: Normocephalic and atraumatic.  Cardiovascular:     Rate and Rhythm: Normal rate and regular rhythm.     Pulses: Normal pulses.     Heart sounds: No murmur heard.    Friction rub present. No gallop.  Pulmonary:     Effort: Pulmonary effort is normal. No respiratory distress.     Breath sounds: Normal breath sounds.  Skin:    General: Skin is warm and dry.  Neurological:     Mental Status: He is alert and oriented to person, place, and time.  Psychiatric:        Mood and Affect: Mood normal.        Behavior: Behavior normal.        Thought Content: Thought content normal.        Judgment: Judgment normal.    Assessment & Plan    Hypothyroidism On levothyroxine  100 mcg daily. Awaiting thyroid  level results. - Continue levothyroxine  100 mcg daily. - Check thyroid  levels in 5-6 weeks.   Prediabetes Last A1c checked 4 months ago with result of 5.5% - Recheck A1c at next appointment.   CKD stage 3a Recent lab results with GFR of 55, stable.  - Recheck labs in 6 months or sooner if needed.   Prostate cancer [facility]) Managed by Oncology and Urology with regular follow ups.  - Follow up as directed.     Follow up   Return in about 6 months (around 04/23/2025) for chronic disease follow up. __________________________________ Zada FREDRIK Palin, DNP, APRN, FNP-BC Primary Care and Sports Medicine [facility] [place] "

## 2025-04-11 ENCOUNTER — Ambulatory Visit

## 2025-04-24 ENCOUNTER — Ambulatory Visit: Admitting: Medical-Surgical
